# Patient Record
Sex: Female | Born: 1950 | Race: Black or African American | Hispanic: No | Smoking: Former smoker
Health system: Southern US, Community
[De-identification: ages and names within clinical notes are randomized; demographics above are authoritative.]

## PROBLEM LIST (undated history)

## (undated) DIAGNOSIS — K5190 Ulcerative colitis, unspecified, without complications: Secondary | ICD-10-CM

## (undated) DIAGNOSIS — D649 Anemia, unspecified: Secondary | ICD-10-CM

## (undated) DIAGNOSIS — K219 Gastro-esophageal reflux disease without esophagitis: Secondary | ICD-10-CM

## (undated) DIAGNOSIS — M19049 Primary osteoarthritis, unspecified hand: Secondary | ICD-10-CM

## (undated) DIAGNOSIS — M1990 Unspecified osteoarthritis, unspecified site: Secondary | ICD-10-CM

## (undated) DIAGNOSIS — E7800 Pure hypercholesterolemia, unspecified: Secondary | ICD-10-CM

## (undated) DIAGNOSIS — K589 Irritable bowel syndrome without diarrhea: Secondary | ICD-10-CM

## (undated) HISTORY — PX: ABDOMINAL HYSTERECTOMY: SHX81

---

## 1977-11-22 HISTORY — PX: TUBAL LIGATION: SHX77

## 2015-02-12 HISTORY — PX: SALIVARY GLAND SURGERY: SHX768

## 2015-03-02 ENCOUNTER — Other Ambulatory Visit: Payer: Self-pay | Admitting: Orthopedic Surgery

## 2015-03-26 ENCOUNTER — Encounter (HOSPITAL_BASED_OUTPATIENT_CLINIC_OR_DEPARTMENT_OTHER): Payer: Self-pay | Admitting: *Deleted

## 2015-03-26 DIAGNOSIS — M19049 Primary osteoarthritis, unspecified hand: Secondary | ICD-10-CM

## 2015-03-26 HISTORY — DX: Primary osteoarthritis, unspecified hand: M19.049

## 2015-04-02 ENCOUNTER — Ambulatory Visit (HOSPITAL_BASED_OUTPATIENT_CLINIC_OR_DEPARTMENT_OTHER): Payer: BLUE CROSS/BLUE SHIELD | Admitting: Anesthesiology

## 2015-04-02 ENCOUNTER — Encounter (HOSPITAL_BASED_OUTPATIENT_CLINIC_OR_DEPARTMENT_OTHER): Payer: Self-pay | Admitting: *Deleted

## 2015-04-02 ENCOUNTER — Encounter (HOSPITAL_BASED_OUTPATIENT_CLINIC_OR_DEPARTMENT_OTHER)
Admission: RE | Disposition: A | Discharge status: Home / Self Care | Payer: Self-pay | Source: Ambulatory Visit | Attending: Orthopedic Surgery

## 2015-04-02 ENCOUNTER — Ambulatory Visit (HOSPITAL_BASED_OUTPATIENT_CLINIC_OR_DEPARTMENT_OTHER)
Admission: RE | Admit: 2015-04-02 | Discharge: 2015-04-02 | Disposition: A | Discharge status: Home / Self Care | Payer: BLUE CROSS/BLUE SHIELD | Source: Ambulatory Visit | Attending: Orthopedic Surgery | Admitting: Orthopedic Surgery

## 2015-04-02 DIAGNOSIS — E780 Pure hypercholesterolemia: Secondary | ICD-10-CM | POA: Diagnosis not present

## 2015-04-02 DIAGNOSIS — Z86718 Personal history of other venous thrombosis and embolism: Secondary | ICD-10-CM | POA: Insufficient documentation

## 2015-04-02 DIAGNOSIS — Z886 Allergy status to analgesic agent status: Secondary | ICD-10-CM | POA: Insufficient documentation

## 2015-04-02 DIAGNOSIS — K219 Gastro-esophageal reflux disease without esophagitis: Secondary | ICD-10-CM | POA: Diagnosis not present

## 2015-04-02 DIAGNOSIS — M180 Bilateral primary osteoarthritis of first carpometacarpal joints: Secondary | ICD-10-CM | POA: Diagnosis not present

## 2015-04-02 DIAGNOSIS — Z91040 Latex allergy status: Secondary | ICD-10-CM | POA: Insufficient documentation

## 2015-04-02 DIAGNOSIS — K589 Irritable bowel syndrome without diarrhea: Secondary | ICD-10-CM | POA: Insufficient documentation

## 2015-04-02 DIAGNOSIS — M19041 Primary osteoarthritis, right hand: Secondary | ICD-10-CM | POA: Diagnosis not present

## 2015-04-02 DIAGNOSIS — Z885 Allergy status to narcotic agent status: Secondary | ICD-10-CM | POA: Insufficient documentation

## 2015-04-02 DIAGNOSIS — K279 Peptic ulcer, site unspecified, unspecified as acute or chronic, without hemorrhage or perforation: Secondary | ICD-10-CM | POA: Insufficient documentation

## 2015-04-02 DIAGNOSIS — M25742 Osteophyte, left hand: Secondary | ICD-10-CM | POA: Diagnosis not present

## 2015-04-02 HISTORY — DX: Ulcerative colitis, unspecified, without complications: K51.90

## 2015-04-02 HISTORY — DX: Gastro-esophageal reflux disease without esophagitis: K21.9

## 2015-04-02 HISTORY — DX: Irritable bowel syndrome, unspecified: K58.9

## 2015-04-02 HISTORY — DX: Pure hypercholesterolemia, unspecified: E78.00

## 2015-04-02 HISTORY — DX: Unspecified osteoarthritis, unspecified site: M19.90

## 2015-04-02 HISTORY — PX: CARPOMETACARPEL SUSPENSION PLASTY: SHX5005

## 2015-04-02 HISTORY — DX: Primary osteoarthritis, unspecified hand: M19.049

## 2015-04-02 SURGERY — CARPOMETACARPEL (CMC) SUSPENSION PLASTY
Anesthesia: General | Site: Hand | Laterality: Left

## 2015-04-02 MED ORDER — ONDANSETRON HCL 4 MG/2ML IJ SOLN
INTRAMUSCULAR | Status: AC
  Filled 2015-04-02: qty 2

## 2015-04-02 MED ORDER — HYDROMORPHONE HCL 1 MG/ML IJ SOLN: 0.2500 mg | INTRAMUSCULAR | Status: DC | PRN

## 2015-04-02 MED ORDER — FENTANYL CITRATE (PF) 100 MCG/2ML IJ SOLN
INTRAMUSCULAR | Status: AC
  Filled 2015-04-02: qty 2

## 2015-04-02 MED ORDER — FENTANYL CITRATE (PF) 100 MCG/2ML IJ SOLN
50.0000 ug | INTRAMUSCULAR | Status: DC | PRN
  Administered 2015-04-02: 75 ug via INTRAVENOUS

## 2015-04-02 MED ORDER — CEFAZOLIN SODIUM-DEXTROSE 2-3 GM-% IV SOLR
2.0000 g | INTRAVENOUS | Status: AC
  Administered 2015-04-02: 2 g via INTRAVENOUS

## 2015-04-02 MED ORDER — FENTANYL CITRATE (PF) 100 MCG/2ML IJ SOLN
INTRAMUSCULAR | Status: AC
  Filled 2015-04-02: qty 4

## 2015-04-02 MED ORDER — CHLORHEXIDINE GLUCONATE 4 % EX LIQD
60.0000 mL | Freq: Once | CUTANEOUS | Status: DC
Start: 2015-04-02 — End: 2015-04-02

## 2015-04-02 MED ORDER — OXYCODONE-ACETAMINOPHEN 10-325 MG PO TABS: 1.0000 | ORAL_TABLET | ORAL | Status: DC | PRN

## 2015-04-02 MED ORDER — VANCOMYCIN HCL IN DEXTROSE 1-5 GM/200ML-% IV SOLN: 1000.0000 mg | INTRAVENOUS | Status: DC

## 2015-04-02 MED ORDER — PHENYLEPHRINE 40 MCG/ML (10ML) SYRINGE FOR IV PUSH (FOR BLOOD PRESSURE SUPPORT)
PREFILLED_SYRINGE | INTRAVENOUS | Status: AC
  Filled 2015-04-02: qty 10

## 2015-04-02 MED ORDER — BUPIVACAINE-EPINEPHRINE (PF) 0.5% -1:200000 IJ SOLN
INTRAMUSCULAR | Status: DC | PRN
  Administered 2015-04-02: 30 mL via PERINEURAL

## 2015-04-02 MED ORDER — ONDANSETRON HCL 4 MG/2ML IJ SOLN
INTRAMUSCULAR | Status: DC | PRN
  Administered 2015-04-02: 4 mg via INTRAVENOUS

## 2015-04-02 MED ORDER — SCOPOLAMINE 1 MG/3DAYS TD PT72: 1.0000 | MEDICATED_PATCH | Freq: Once | TRANSDERMAL | Status: DC | PRN

## 2015-04-02 MED ORDER — GLYCOPYRROLATE 0.2 MG/ML IJ SOLN: 0.2000 mg | Freq: Once | INTRAMUSCULAR | Status: DC | PRN

## 2015-04-02 MED ORDER — MIDAZOLAM HCL 2 MG/2ML IJ SOLN
INTRAMUSCULAR | Status: AC
  Filled 2015-04-02: qty 4

## 2015-04-02 MED ORDER — MIDAZOLAM HCL 2 MG/2ML IJ SOLN
INTRAMUSCULAR | Status: AC
  Filled 2015-04-02: qty 2

## 2015-04-02 MED ORDER — DEXAMETHASONE SODIUM PHOSPHATE 10 MG/ML IJ SOLN
INTRAMUSCULAR | Status: DC | PRN
  Administered 2015-04-02: 10 mg via INTRAVENOUS

## 2015-04-02 MED ORDER — LIDOCAINE HCL (CARDIAC) 20 MG/ML IV SOLN
INTRAVENOUS | Status: AC
  Filled 2015-04-02: qty 5

## 2015-04-02 MED ORDER — CEFAZOLIN SODIUM-DEXTROSE 2-3 GM-% IV SOLR
INTRAVENOUS | Status: AC
  Filled 2015-04-02: qty 50

## 2015-04-02 MED ORDER — MIDAZOLAM HCL 2 MG/2ML IJ SOLN
1.0000 mg | INTRAMUSCULAR | Status: DC | PRN
  Administered 2015-04-02: 1.5 mg via INTRAVENOUS

## 2015-04-02 MED ORDER — LACTATED RINGERS IV SOLN
INTRAVENOUS | Status: DC
  Administered 2015-04-02: 11:00:00 via INTRAVENOUS
  Administered 2015-04-02: 10 mL/h via INTRAVENOUS

## 2015-04-02 MED ORDER — PROPOFOL 10 MG/ML IV BOLUS
INTRAVENOUS | Status: DC | PRN
  Administered 2015-04-02: 200 mg via INTRAVENOUS

## 2015-04-02 MED ORDER — PROPOFOL 500 MG/50ML IV EMUL
INTRAVENOUS | Status: AC
  Filled 2015-04-02: qty 50

## 2015-04-02 MED ORDER — DEXAMETHASONE SODIUM PHOSPHATE 10 MG/ML IJ SOLN
INTRAMUSCULAR | Status: AC
  Filled 2015-04-02: qty 1

## 2015-04-02 MED ORDER — PROMETHAZINE HCL 25 MG/ML IJ SOLN: 6.2500 mg | INTRAMUSCULAR | Status: DC | PRN

## 2015-04-02 MED ORDER — CHLORHEXIDINE GLUCONATE 4 % EX LIQD: 60.0000 mL | Freq: Once | CUTANEOUS | Status: DC

## 2015-04-02 MED ORDER — LIDOCAINE HCL (CARDIAC) 20 MG/ML IV SOLN
INTRAVENOUS | Status: DC | PRN
  Administered 2015-04-02: 50 mg via INTRAVENOUS

## 2015-04-02 SURGICAL SUPPLY — 65 items
BIT DRILL 1/16X5 DISP (BIT) ×2 IMPLANT
BIT DRILL JACOB END 9/64INX5IN (BIT) ×2 IMPLANT
BLADE ARTHRO LOK 4 BEAVER (BLADE) ×2 IMPLANT
BLADE MINI RND TIP GREEN BEAV (BLADE) ×2 IMPLANT
BLADE SURG 15 STRL LF DISP TIS (BLADE) ×1 IMPLANT
BLADE SURG 15 STRL SS (BLADE) ×1
BNDG COHESIVE 3X5 TAN STRL LF (GAUZE/BANDAGES/DRESSINGS) ×2 IMPLANT
BNDG ESMARK 4X9 LF (GAUZE/BANDAGES/DRESSINGS) ×2 IMPLANT
BNDG GAUZE ELAST 4 BULKY (GAUZE/BANDAGES/DRESSINGS) ×2 IMPLANT
BUR EGG 3PK/BX (BURR) IMPLANT
CHLORAPREP W/TINT 26ML (MISCELLANEOUS) ×2 IMPLANT
CORDS BIPOLAR (ELECTRODE) ×2 IMPLANT
COVER BACK TABLE 60X90IN (DRAPES) ×2 IMPLANT
COVER MAYO STAND STRL (DRAPES) ×4 IMPLANT
CUFF TOURNIQUET SINGLE 18IN (TOURNIQUET CUFF) ×2 IMPLANT
DECANTER SPIKE VIAL GLASS SM (MISCELLANEOUS) IMPLANT
DRAPE EXTREMITY T 121X128X90 (DRAPE) ×2 IMPLANT
DRAPE OEC MINIVIEW 54X84 (DRAPES) ×2 IMPLANT
DRAPE SURG 17X23 STRL (DRAPES) ×2 IMPLANT
GAUZE SPONGE 4X4 12PLY STRL (GAUZE/BANDAGES/DRESSINGS) ×2 IMPLANT
GAUZE SPONGE 4X4 16PLY XRAY LF (GAUZE/BANDAGES/DRESSINGS) IMPLANT
GAUZE XEROFORM 1X8 LF (GAUZE/BANDAGES/DRESSINGS) ×2 IMPLANT
GLOVE BIOGEL M STRL SZ7.5 (GLOVE) ×2 IMPLANT
GLOVE BIOGEL PI IND STRL 7.0 (GLOVE) ×1 IMPLANT
GLOVE BIOGEL PI IND STRL 8.5 (GLOVE) ×1 IMPLANT
GLOVE BIOGEL PI INDICATOR 7.0 (GLOVE) ×1
GLOVE BIOGEL PI INDICATOR 8.5 (GLOVE) ×1
GLOVE SURG ORTHO 8.0 STRL STRW (GLOVE) IMPLANT
GLOVE SURG SS PI 6.5 STRL IVOR (GLOVE) ×2 IMPLANT
GLOVE SURG SS PI 7.5 STRL IVOR (GLOVE) ×2 IMPLANT
GLOVE SURG SS PI 8.0 STRL IVOR (GLOVE) ×2 IMPLANT
GOWN STRL REUS W/ TWL LRG LVL3 (GOWN DISPOSABLE) ×2 IMPLANT
GOWN STRL REUS W/TWL LRG LVL3 (GOWN DISPOSABLE) ×2
GOWN STRL REUS W/TWL XL LVL3 (GOWN DISPOSABLE) ×2 IMPLANT
NEEDLE PRECISIONGLIDE 27X1.5 (NEEDLE) ×2 IMPLANT
NS IRRIG 1000ML POUR BTL (IV SOLUTION) ×2 IMPLANT
PACK BASIN DAY SURGERY FS (CUSTOM PROCEDURE TRAY) ×2 IMPLANT
PAD CAST 3X4 CTTN HI CHSV (CAST SUPPLIES) ×1 IMPLANT
PADDING CAST ABS 3INX4YD NS (CAST SUPPLIES)
PADDING CAST ABS COTTON 3X4 (CAST SUPPLIES) IMPLANT
PADDING CAST COTTON 3X4 STRL (CAST SUPPLIES) ×1
RUBBERBAND STERILE (MISCELLANEOUS) IMPLANT
SLEEVE SCD COMPRESS KNEE MED (MISCELLANEOUS) ×2 IMPLANT
SLING ARM FOAM STRAP MED (SOFTGOODS) ×2 IMPLANT
SPLINT PLASTER CAST XFAST 3X15 (CAST SUPPLIES) IMPLANT
SPLINT PLASTER XTRA FASTSET 3X (CAST SUPPLIES)
STOCKINETTE 4X48 STRL (DRAPES) ×2 IMPLANT
SUT ETHIBOND 2 OS 4 DA (SUTURE) IMPLANT
SUT ETHIBOND 3-0 V-5 (SUTURE) IMPLANT
SUT ETHILON 4 0 PS 2 18 (SUTURE) ×2 IMPLANT
SUT FIBERWIRE 2-0 18 17.9 3/8 (SUTURE)
SUT FIBERWIRE 4-0 18 DIAM BLUE (SUTURE) ×2
SUT MERSILENE 4 0 P 3 (SUTURE) IMPLANT
SUT STEEL 3 0 (SUTURE) IMPLANT
SUT STEEL 4 0 (SUTURE) ×2 IMPLANT
SUT VIC AB 4-0 P-3 18XBRD (SUTURE) IMPLANT
SUT VIC AB 4-0 P2 18 (SUTURE) IMPLANT
SUT VIC AB 4-0 P3 18 (SUTURE)
SUTURE FIBERWR 2-0 18 17.9 3/8 (SUTURE) IMPLANT
SUTURE FIBERWR 4-0 18 DIA BLUE (SUTURE) ×1 IMPLANT
SYR BULB 3OZ (MISCELLANEOUS) ×2 IMPLANT
SYR CONTROL 10ML LL (SYRINGE) ×2 IMPLANT
TOWEL OR 17X24 6PK STRL BLUE (TOWEL DISPOSABLE) ×2 IMPLANT
TOWEL OR NON WOVEN STRL DISP B (DISPOSABLE) IMPLANT
UNDERPAD 30X30 (UNDERPADS AND DIAPERS) ×2 IMPLANT

## 2015-04-02 NOTE — Brief Op Note (Signed)
04/02/2015  12:11 PM  PATIENT:  Paige Turner  64 y.o. female  PRE-OPERATIVE DIAGNOSIS:  Carpometacarpal Arthritis Left Thumb  POST-OPERATIVE DIAGNOSIS:  Carpometacarpal Arthritis Left Thumb  PROCEDURE:  Procedure(s): EXCISION TRAPEZIUM SUSPENSIONPLASTY LEFT THUMB ABDUCTOR POLLICIS LONGUS TRANSFER (Left)  SURGEON:  Surgeon(s) and Role:    * Cindee Salt, MD - Primary  PHYSICIAN ASSISTANT:   ASSISTANTS: R Dasnoit,PAC   ANESTHESIA:   regional and general  EBL:  Total I/O In: 1500 [I.V.:1500] Out: -   BLOOD ADMINISTERED:none  DRAINS: none   LOCAL MEDICATIONS USED:  NONE  SPECIMEN:  No Specimen  DISPOSITION OF SPECIMEN:  N/A  COUNTS:  YES  TOURNIQUET:   Total Tourniquet Time Documented: Upper Arm (Left) - 74 minutes Total: Upper Arm (Left) - 74 minutes   DICTATION: .Other Dictation: Dictation Number [id number]  PLAN OF CARE: Discharge to home after PACU  PATIENT DISPOSITION:  PACU - hemodynamically stable.

## 2015-04-02 NOTE — H&P (Signed)
Paige Turner is a 64 year-old right-hand dominant female complaining of bilateral CMC pain.  She has been on anti-inflammatories, splinting, paraffin wax bath, glucosamine and chondroitin sulfate, she has had injections done and this has not resolved symptoms for her.  This has been going on for approximately four years. She has been treated in [facility] by Dr. Rosalia Hammers, [place] and, subsequently, Cregan, who has referred her.  She has been on meloxicam, tramadol, diclofenac. She has been on prednisone all without relief.  She has no history of diabetes, thyroid problems, arthritis or gout.   ALLERGIES:    Aspirin, Tylenol with codeine and Augmentin   MEDICATIONS:   AcipHex, WelChol, Lialda, meloxicam, pantoprazole, clonidine, glycopyrrolate, mercaptopurine, Rowena, hyoscyamine, Uceris, tramadol, diclofenac, omeprazole and ondansetron.  PAST SURGICAL HISTORY:    Partial hysterectomy.  FAMILY MEDICAL HISTORY:  Positive for arthritis otherwise negative.   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  REVIEW OF SYSTEMS:   Positive for glasses, contacts, otherwise negative 14 points.  Paige Turner is an 64 y.o. female.   Chief Complaint: Pain left thumb HPI: see above  Past Medical History  Diagnosis Date  . Ulcerative colitis   . GERD (gastroesophageal reflux disease)   . Irritable bowel syndrome (IBS)   . Arthritis of carpometacarpal joint 03/2015    left thumb  . Arthritis     right hand  . High cholesterol     Past Surgical History  Procedure Laterality Date  . Salivary gland surgery  02/12/2015    blockage in gland  . Abdominal hysterectomy      partial  . Tubal ligation  11/1977    History reviewed. No pertinent family history. Social History:  reports that she has never smoked. She has never used smokeless tobacco. She reports that she drinks alcohol. She reports that she does not use illicit  drugs.  Allergies:  Allergies  Allergen Reactions  . Aspirin Other (See Comments)    GI UPSET  . Augmentin [Amoxicillin-Pot Clavulanate] Nausea And Vomiting  . Codeine Other (See Comments)    GI UPSET  . Latex Other (See Comments)    POSITIVE ON ALLERGY TESTING    Medications Prior to Admission  Medication Sig Dispense Refill  . Ascorbic Acid Buffered (BUFFERED VITAMIN C PO) Take by mouth.    . Biotin 1000 MCG tablet Take 1,000 mcg by mouth daily.    . Calcium Citrate-Vitamin D (CALCIUM + D PO) Take by mouth.    . cholecalciferol (VITAMIN D) 1000 UNITS tablet Take 1,000 Units by mouth daily.    . colesevelam (WELCHOL) 625 MG tablet Take 1,875 mg by mouth 2 (two) times daily with a meal.    . glucosamine-chondroitin 500-400 MG tablet Take 1 tablet by mouth 3 (three) times daily.    Marland Kitchen glycopyrrolate (ROBINUL) 2 MG tablet Take 2 mg by mouth 2 (two) times daily.    . mercaptopurine (PURINETHOL) 50 MG tablet Take 25 mg by mouth 2 (two) times daily. Give on an empty stomach 1 hour before or 2 hours after meals. Caution: Chemotherapy.    . mesalamine (LIALDA) 1.2 G EC tablet Take 4.8 g by mouth daily with breakfast.    . RABEprazole (ACIPHEX) 20 MG tablet Take 20 mg by mouth 2 (two) times daily.      No results found for this or any previous visit (from the past 48 hour(s)).  No results  found.   Pertinent items are noted in HPI.  Blood pressure 140/82, pulse 66, temperature 97.8 F (36.6 C), temperature source Oral, resp. rate 20, height 5\' 7"  (1.702 m), weight 87.544 kg (193 lb), SpO2 100 %.  General appearance: alert, cooperative and appears stated age Head: Normocephalic, without obvious abnormality Neck: no JVD Resp: clear to auscultation bilaterally Cardio: regular rate and rhythm, S1, S2 normal, no murmur, click, rub or gallop GI: soft, non-tender; bowel sounds normal; no masses,  no organomegaly Extremities: pain left thumb base Pulses: 2+ and symmetric Skin: Skin color,  texture, turgor normal. No rashes or lesions Neurologic: Grossly normal Incision/Wound: na  Assessment/Plan DIAGNOSIS:   CMC arthritis, bilateral thumbs, Eaton stage IV, left side.  RECOMMENDATIONS/PLAN:   She would like to proceed with surgical intervention.   She is scheduled for suspensionplasty, left thumb as an outpatient under regional anesthesia.   The pre, peri and postoperative course were discussed along with the risks and complications.  The patient is aware there is no guarantee with the surgery, possibility of infection, recurrence, injury to arteries, nerves, tendons, incomplete relief of symptoms and dystrophy.   Briasia Flinders R 04/02/2015, 9:28 AM

## 2015-04-02 NOTE — Anesthesia Postprocedure Evaluation (Signed)
  Anesthesia Post-op Note  Patient: Paige Turner  Procedure(s) Performed: Procedure(s): EXCISION TRAPEZIUM SUSPENSIONPLASTY LEFT THUMB ABDUCTOR POLLICIS LONGUS TRANSFER (Left)  Patient Location: PACU  Anesthesia Type:General and Regional  Level of Consciousness: awake  Airway and Oxygen Therapy: Patient Spontanous Breathing  Post-op Pain: none  Post-op Assessment: Post-op Vital signs reviewed, Patient's Cardiovascular Status Stable, Respiratory Function Stable, Patent Airway, No signs of Nausea or vomiting and Pain level controlled              Post-op Vital Signs: Reviewed and stable  Last Vitals:  Filed Vitals:   04/02/15 1351  BP: 129/73  Pulse: 60  Temp: 36.6 C  Resp: 18    Complications: No apparent anesthesia complications

## 2015-04-02 NOTE — Progress Notes (Signed)
Assisted Dr. Massagee with left, ultrasound guided, supraclavicular block. Side rails up, monitors on throughout procedure. See vital signs in flow sheet. Tolerated Procedure well. 

## 2015-04-02 NOTE — Transfer of Care (Signed)
Immediate Anesthesia Transfer of Care Note  Patient: Paige Turner  Procedure(s) Performed: Procedure(s): EXCISION TRAPEZIUM SUSPENSIONPLASTY LEFT THUMB ABDUCTOR POLLICIS LONGUS TRANSFER (Left)  Patient Location: PACU  Anesthesia Type:General  Level of Consciousness: awake  Airway & Oxygen Therapy: Patient Spontanous Breathing and Patient connected to face mask oxygen  Post-op Assessment: Report given to RN and Post -op Vital signs reviewed and stable  Post vital signs: Reviewed and stable  Last Vitals:  Filed Vitals:   04/02/15 1000  BP: 116/62  Pulse: 61  Temp:   Resp: 9    Complications: No apparent anesthesia complications

## 2015-04-02 NOTE — Anesthesia Preprocedure Evaluation (Addendum)
Anesthesia Evaluation  Patient identified by MRN, date of birth, ID band Patient awake    Reviewed: Allergy & Precautions, NPO status , Patient's Chart, lab work & pertinent test results  History of Anesthesia Complications Negative for: history of anesthetic complications  Airway Mallampati: III  TM Distance: <3 FB Neck ROM: Limited    Dental  (+) Teeth Intact   Pulmonary neg pulmonary ROS,    breath sounds clear to auscultation       Cardiovascular + DVT  negative cardio ROS   Rhythm:Regular Rate:Normal     Neuro/Psych negative neurological ROS     GI/Hepatic Neg liver ROS, PUD, GERD  ,  Endo/Other  negative endocrine ROS  Renal/GU negative Renal ROS     Musculoskeletal  (+) Arthritis ,   Abdominal   Peds  Hematology   Anesthesia Other Findings   Reproductive/Obstetrics                             Anesthesia Physical Anesthesia Plan  ASA: II  Anesthesia Plan: General   Post-op Pain Management: GA combined w/ Regional for post-op pain   Induction: Intravenous  Airway Management Planned: LMA  Additional Equipment:   Intra-op Plan:   Post-operative Plan: Extubation in OR  Informed Consent: I have reviewed the patients History and Physical, chart, labs and discussed the procedure including the risks, benefits and alternatives for the proposed anesthesia with the patient or authorized representative who has indicated his/her understanding and acceptance.   Dental advisory given  Plan Discussed with: CRNA and Surgeon  Anesthesia Plan Comments:         Anesthesia Quick Evaluation

## 2015-04-02 NOTE — Anesthesia Procedure Notes (Addendum)
Anesthesia Regional Block:  Supraclavicular block  Pre-Anesthetic Checklist: ,, timeout performed, Correct Patient, Correct Site, Correct Laterality, Correct Procedure, Correct Position, site marked, Risks and benefits discussed,  Surgical consent,  Pre-op evaluation,  At surgeon's request and post-op pain management  Laterality: Left and Upper     Needles:   Needle Type: Stimulator Needle - 80     Needle Length: 9cm 9 cm   Needle insertion depth: 5 cm   Additional Needles:  Procedures: ultrasound guided (picture in chart) and nerve stimulator Supraclavicular block Narrative:  Start time: 04/02/2015 9:50 AM End time: 04/02/2015 10:05 AM Injection made incrementally with aspirations every 5 mL.  Performed by: Personally  Anesthesiologist: MASSAGEE, TERRY  Additional Notes: Tolerated well   Procedure Name: LMA Insertion Date/Time: 04/02/2015 10:44 AM Performed by: Caren Macadam Pre-anesthesia Checklist: Patient identified, Emergency Drugs available, Suction available and Patient being monitored Patient Re-evaluated:Patient Re-evaluated prior to inductionOxygen Delivery Method: Circle System Utilized Preoxygenation: Pre-oxygenation with 100% oxygen Intubation Type: IV induction Ventilation: Mask ventilation without difficulty LMA: LMA inserted LMA Size: 4.0 Number of attempts: 1 Airway Equipment and Method: Bite block Placement Confirmation: positive ETCO2 and breath sounds checked- equal and bilateral Tube secured with: Tape Dental Injury: Teeth and Oropharynx as per pre-operative assessment

## 2015-04-02 NOTE — Op Note (Signed)
   Dictation Number [id number]

## 2015-04-02 NOTE — Discharge Instructions (Addendum)
[facility] Instructions °Hand Surgery ° °Wound Care: °Keep your hand elevated above the level of your heart.  Do not allow it to dangle by your side.  Keep the dressing dry and do not remove it unless your doctor advises you to do so.  He will usually change it at the time of your post-op visit.  Moving your fingers is advised to stimulate circulation but will depend on the site of your surgery.  If you have a splint applied, your doctor will advise you regarding movement. ° °Activity: °Do not drive or operate machinery today.  Rest today and then you may return to your normal activity and work as indicated by your physician. ° °Diet:  °Drink liquids today or eat a light diet.  You may resume a regular diet tomorrow.   ° °General expectations: °Pain for two to three days. °Fingers may become slightly swollen. ° °Call your doctor if any of the following occur: °Severe pain not relieved by pain medication. °Elevated temperature. °Dressing soaked with blood. °Inability to move fingers. °White or bluish color to fingers. ° ° °Post Anesthesia Home Care Instructions ° °Activity: °Get plenty of rest for the remainder of the day. A responsible adult should stay with you for 24 hours following the procedure.  °For the next 24 hours, DO NOT: °-Drive a car °-Operate machinery °-Drink alcoholic beverages °-Take any medication unless instructed by your physician °-Make any legal decisions or sign important papers. ° °Meals: °Start with liquid foods such as gelatin or soup. Progress to regular foods as tolerated. Avoid greasy, spicy, heavy foods. If nausea and/or vomiting occur, drink only clear liquids until the nausea and/or vomiting subsides. Call your physician if vomiting continues. ° °Special Instructions/Symptoms: °Your throat may feel dry or sore from the anesthesia or the breathing tube placed in your throat during surgery. If this causes discomfort, gargle with warm salt water. The discomfort should disappear within 24  hours. ° °If you had a scopolamine patch placed behind your ear for the management of post- operative nausea and/or vomiting: ° °1. The medication in the patch is effective for 72 hours, after which it should be removed.  Wrap patch in a tissue and discard in the trash. Wash hands thoroughly with soap and water. °2. You may remove the patch earlier than 72 hours if you experience unpleasant side effects which may include dry mouth, dizziness or visual disturbances. °3. Avoid touching the patch. Wash your hands with soap and water after contact with the patch. °  °Call your surgeon if you experience:  ° °1.  Fever over 101.0. °2.  Inability to urinate. °3.  Nausea and/or vomiting. °4.  Extreme swelling or bruising at the surgical site. °5.  Continued bleeding from the incision. °6.  Increased pain, redness or drainage from the incision. °7.  Problems related to your pain medication. °8. Any change in color, movement and/or sensation °9. Any problems and/or concerns ° ° °

## 2015-04-03 ENCOUNTER — Encounter (HOSPITAL_BASED_OUTPATIENT_CLINIC_OR_DEPARTMENT_OTHER): Payer: Self-pay | Admitting: Orthopedic Surgery

## 2015-04-03 NOTE — Op Note (Signed)
NAMENILI, HONDA NO.:  [id number]  MEDICAL RECORD NO.:  [id number]  LOCATION:                                 FACILITY:  PHYSICIAN:  Cindee Salt, M.D.            DATE OF BIRTH:  DATE OF PROCEDURE:  04/02/2015 DATE OF DISCHARGE:                              OPERATIVE REPORT   PREOPERATIVE DIAGNOSIS:  Carpometacarpal (CMC) arthritis, pantrapezial in nature, left thumb.  POSTOPERATIVE DIAGNOSIS:  Carpometacarpal (CMC) arthritis, pantrapezial in nature, left thumb.  OPERATION:  Excision of trapezium, resection of proximal trapezoid, suspensionplasty using APL, left thumb.  SURGEON:  Cindee Salt, M.D.  ASSISTANT:  Jonni Sanger, PAC.  ANESTHESIA:  Supraclavicular block, general.  ANESTHESIOLOGIST:  Burna Forts, M.D.  HISTORY:  The patient is a 64 year old female, with a history of carpometacarpal arthritis, pantrapezial in nature of her left thumb. This is not responded to conservative treatment.  She has elected to undergo reconstruction with suspensionplasty.  Pre, peri, and postoperative course have been discussed along with risks and complications.  She is aware that there is no guarantee with the surgery, possibility of infection; recurrence of injury to arteries, nerves or tendons; incomplete relief of symptoms; and dystrophy.  In the preoperative area, the patient is seen, the extremity marked by both patient and surgeon.  Antibiotic given.  PROCEDURE IN DETAIL:  The patient was brought to the operating room, where a supraclavicular block having been carried out in the preoperative area, was supplemented with general anesthesia.  She was prepped and draped in supine position with the left arm free.  Prep was done with ChloraPrep, 3 minute dry time allowed.  Time-out taken, confirming the patient and procedure.  The limb was exsanguinated with an Esmarch bandage.  Tourniquet placed high on the arm was inflated to 250 mmHg.  A  curvilinear incision was made over the base of the thumb metacarpal onto the line, in line with the first dorsal compartment, carried down through the subcutaneous tissue.  Radial sensory nerves were identified and protected.  The dissection was carried between the extensor pollicis brevis and the abductor pollicis longus.  The carpometacarpal joint was opened and inspected.  This was found to be extremely arthritic.  The dissection was carried proximally to the STT joint.  This was also found to be arthritic with sharp dissection.  The radial artery was identified and protected.  The trapezium was then isolated.  This had significant enlargement, both proximally and distally, secondary to osteophyte formation.  This was excised in 2 large pieces.  The proximal aspect of the trapezoid was also resected with a rongeur to free any arthritic change at the scaphotrapezoid joint.  The most dorsal aspect of the abductor pollicis longus tendon was then isolated.  This was inserting into the bone, drill holes were placed in the base of the thumb metacarpal, obliquely from the dorsal radial to the mid volar ulnar portion of the first metacarpal.  A separate incision was then made.  The harvest of the abductor pollicis longus was placed at the musculotendinous junction.  The abductor  pollicis longus was isolated.  A 30-gauge monofilament wire was then used as a cheese cutter to isolate the most dorsal portion of this. This was then transected at the musculotendinous junction.  The wound irrigated and the skin closed with interrupted 4-0 nylon sutures.  The abducted pollicis longus was then delivered distally.  The most volar aspect of the second metacarpal had been eroded by the trapezium and had a large defect in it.  This provided an adequate placement for the drill hole for the abductor pollicis longus transfer through the second metacarpal.  This was done from a volar to dorsal position.  A  separate incision was then made dorsally to allow passage of the abductor pollicis longus tendon.  This was attempted.  The tendon portion was found to be too large.  This was then separated into a three-quarter and one-quarter portion.  The three-quarter portion was then able to be passed through the 2 drill holes from the dorsal to palmar on the thumb metacarpal and from volar to dorsal on the index metacarpal.  A dissection was then carried along the periosteal margin of the second metacarpal, up to where the abductor pollicis longus tendon egressed from the second metacarpal and this tendon was then passed around the second metacarpal back into the volar wound of the vacated trapezium. This was then passed through the abductor pollicis longus tendon, sutured with figure-of-eight 3-0 FiberWire sutures and 2 separate passes.  This firmly fixed the tendon.  A moderate amount of tendon was still remaining with pressure.  There was no proximal translation of the first metacarpal on the second.  X-rays confirmed this.  The wounds were irrigated.  The remainder of the transected one-quarter of the abductor pollicis longus was then passed through the flexor carpi radialis forming a double-loop anchovy and sutured to itself with the 3-0 FiberWire sutures, providing a position very similar to gracilis transfer.  This was placed in the proximal aspect of the trapezium defect.  Wound was again irrigated.  The capsule could not be closed. This was then placed in its proper position.  The subcutaneous tissue closed with interrupted 4-0 Vicryl and the skin with interrupted 4-0 nylon sutures.  A sterile compressive dressing, dorsal palmar thumb spica splint was applied.  On deflation of the tourniquet, all fingers immediately pinked.  She was taken to the recovery room for observation in satisfactory condition.  She will be discharged home to return to the [facility] in 1 week on  Percocet.          ______________________________ Cindee Salt, M.D.     GK/MEDQ  D:  04/02/2015  T:  04/03/2015  Job:  [id number]

## 2015-11-23 HISTORY — PX: BUNIONECTOMY: SHX129

## 2016-01-14 ENCOUNTER — Other Ambulatory Visit: Payer: Self-pay | Admitting: Orthopaedic Surgery

## 2016-01-14 DIAGNOSIS — M47812 Spondylosis without myelopathy or radiculopathy, cervical region: Secondary | ICD-10-CM

## 2016-01-23 ENCOUNTER — Ambulatory Visit
Admission: RE | Admit: 2016-01-23 | Discharge: 2016-01-23 | Disposition: A | Discharge status: Home / Self Care | Payer: BLUE CROSS/BLUE SHIELD | Source: Ambulatory Visit | Attending: Orthopaedic Surgery | Admitting: Orthopaedic Surgery

## 2016-01-23 DIAGNOSIS — M47812 Spondylosis without myelopathy or radiculopathy, cervical region: Secondary | ICD-10-CM

## 2016-01-29 ENCOUNTER — Other Ambulatory Visit: Payer: Self-pay | Admitting: Orthopaedic Surgery

## 2016-01-29 DIAGNOSIS — M546 Pain in thoracic spine: Secondary | ICD-10-CM

## 2016-02-06 ENCOUNTER — Ambulatory Visit
Admission: RE | Admit: 2016-02-06 | Discharge: 2016-02-06 | Disposition: A | Discharge status: Home / Self Care | Payer: BLUE CROSS/BLUE SHIELD | Source: Ambulatory Visit | Attending: Orthopaedic Surgery | Admitting: Orthopaedic Surgery

## 2016-02-06 DIAGNOSIS — M546 Pain in thoracic spine: Secondary | ICD-10-CM

## 2016-02-26 ENCOUNTER — Encounter (HOSPITAL_COMMUNITY)
Admission: RE | Admit: 2016-02-26 | Discharge: 2016-02-26 | Disposition: A | Discharge status: Home / Self Care | Payer: BLUE CROSS/BLUE SHIELD | Source: Ambulatory Visit | Attending: Orthopaedic Surgery | Admitting: Orthopaedic Surgery

## 2016-02-26 ENCOUNTER — Ambulatory Visit (HOSPITAL_COMMUNITY)
Admission: RE | Admit: 2016-02-26 | Discharge: 2016-02-26 | Disposition: A | Discharge status: Home / Self Care | Payer: BLUE CROSS/BLUE SHIELD | Source: Ambulatory Visit | Attending: Surgery | Admitting: Surgery

## 2016-02-26 ENCOUNTER — Encounter (HOSPITAL_COMMUNITY): Payer: Self-pay

## 2016-02-26 DIAGNOSIS — Z01812 Encounter for preprocedural laboratory examination: Secondary | ICD-10-CM | POA: Insufficient documentation

## 2016-02-26 DIAGNOSIS — Z01818 Encounter for other preprocedural examination: Secondary | ICD-10-CM | POA: Diagnosis not present

## 2016-02-26 HISTORY — DX: Anemia, unspecified: D64.9

## 2016-02-26 LAB — URINE MICROSCOPIC-ADD ON: RBC / HPF: NONE SEEN RBC/hpf (ref 0–5)

## 2016-02-26 LAB — URINALYSIS, ROUTINE W REFLEX MICROSCOPIC
BILIRUBIN URINE: NEGATIVE
Glucose, UA: NEGATIVE mg/dL
KETONES UR: NEGATIVE mg/dL
NITRITE: NEGATIVE
PH: 6 (ref 5.0–8.0)
Protein, ur: NEGATIVE mg/dL
SPECIFIC GRAVITY, URINE: 1.012 (ref 1.005–1.030)

## 2016-02-26 LAB — COMPREHENSIVE METABOLIC PANEL
ALBUMIN: 3.8 g/dL (ref 3.5–5.0)
ALK PHOS: 60 U/L (ref 38–126)
ALT: 19 U/L (ref 14–54)
ANION GAP: 7 (ref 5–15)
AST: 20 U/L (ref 15–41)
BUN: 10 mg/dL (ref 6–20)
CALCIUM: 9.4 mg/dL (ref 8.9–10.3)
CO2: 25 mmol/L (ref 22–32)
Chloride: 107 mmol/L (ref 101–111)
Creatinine, Ser: 0.78 mg/dL (ref 0.44–1.00)
GFR calc Af Amer: >60 mL/min (ref >60)
GFR calc non Af Amer: >60 mL/min (ref >60)
GLUCOSE: 79 mg/dL (ref 65–99)
POTASSIUM: 4.1 mmol/L (ref 3.5–5.1)
SODIUM: 139 mmol/L (ref 135–145)
TOTAL PROTEIN: 7.1 g/dL (ref 6.5–8.1)
Total Bilirubin: 0.4 mg/dL (ref 0.3–1.2)

## 2016-02-26 LAB — CBC
HEMATOCRIT: 40.2 % (ref 36.0–46.0)
HEMOGLOBIN: 12.7 g/dL (ref 12.0–15.0)
MCH: 29.7 pg (ref 26.0–34.0)
MCHC: 31.6 g/dL (ref 30.0–36.0)
MCV: 93.9 fL (ref 78.0–100.0)
Platelets: 325 10*3/uL (ref 150–400)
RBC: 4.28 MIL/uL (ref 3.87–5.11)
RDW: 13.9 % (ref 11.5–15.5)
WBC: 5.5 10*3/uL (ref 4.0–10.5)

## 2016-02-26 LAB — PROTIME-INR
INR: 1.08
PROTHROMBIN TIME: 14 s (ref 11.4–15.2)

## 2016-02-26 LAB — APTT: aPTT: 31 seconds (ref 24–36)

## 2016-02-26 LAB — SURGICAL PCR SCREEN
MRSA, PCR: NEGATIVE
STAPHYLOCOCCUS AUREUS: NEGATIVE

## 2016-02-26 NOTE — Pre-Procedure Instructions (Signed)
    Paige Turner  02/26/2016      [id number] - [place], [place] - [address] [place] [address] Phone: [phone] Fax: [phone]    Your procedure is scheduled on 03-11-2016   Friday   Report to [facility] Admitting at 5:30 A.M.   Call this number if you have problems the morning of surgery:  [phone]   Remember:  Do not eat food or drink liquids after midnight.   Take these medicines the morning of surgery with A SIP OF WATER Robinul, mesalamine(Lialda),Rabeprazole(Aciphex)  STOP ASPIRIN,ANTIINFLAMATORIES (IBUPROFEN,ALEVE,MOTRIN,ADVIL,GOODY'S POWDERS),HERBAL SUPPLEMENTS,FISH OIL,AND VITAMINS 5-7 DAYS PRIOR TO SURGERY   Do not wear jewelry, make-up or nail polish.  Do not wear lotions, powders, or perfumes.  You may  NOT wear deoderant.  Do not shave 48 hours prior to surgery.   .  Do not bring valuables to the hospital.  [facility] is not responsible for any belongings or valuables.  Contacts, dentures or bridgework may not be worn into surgery.  Leave your suitcase in the car.  After surgery it may be brought to your room.  For patients admitted to the hospital, discharge time will be determined by your treatment team.  Patients discharged the day of surgery will not be allowed to drive home.    Special instructions:  See attached Sheet for instructions on CHG Shower  Please read over the following fact sheets that you were given. MRSA Information and Surgical Site Infection Prevention

## 2016-03-10 NOTE — Anesthesia Preprocedure Evaluation (Addendum)
Anesthesia Evaluation  Patient identified by MRN, date of birth, ID band Patient awake    Reviewed: Allergy & Precautions, NPO status , Patient's Chart, lab work & pertinent test results  History of Anesthesia Complications Negative for: history of anesthetic complications  Airway Mallampati: II  TM Distance: <3 FB Neck ROM: Limited   Comment: Cannot bite upper lip Dental  (+) Dental Advisory Given, Teeth Intact   Pulmonary neg shortness of breath, neg sleep apnea, neg COPD, neg recent URI, former smoker,    Pulmonary exam normal breath sounds clear to auscultation       Cardiovascular (-) hypertension(-) angina(-) Past MI, (-) Cardiac Stents, (-) CABG, (-) Orthopnea and (-) PND (-) dysrhythmias  Rhythm:Regular Rate:Normal     Neuro/Psych neg Seizures neck pain, left arm pain, left arm numbness, weakness.  She has noticed she has had a little bit of problems with her legs with weakness.  She denies any associated bowel or bladder symptoms.     GI/Hepatic Neg liver ROS, PUD, GERD  Medicated,Ulcerative colitis, IBS   Endo/Other  neg diabetes  Renal/GU negative Renal ROS     Musculoskeletal  (+) Arthritis ,   Abdominal   Peds  Hematology  (+) Blood dyscrasia, anemia ,   Anesthesia Other Findings HLD  Reproductive/Obstetrics                           Anesthesia Physical Anesthesia Plan  ASA: II  Anesthesia Plan: General   Post-op Pain Management:    Induction: Intravenous  Airway Management Planned: Oral ETT and Video Laryngoscope Planned  Additional Equipment:   Intra-op Plan:   Post-operative Plan: Extubation in OR  Informed Consent: I have reviewed the patients History and Physical, chart, labs and discussed the procedure including the risks, benefits and alternatives for the proposed anesthesia with the patient or authorized representative who has indicated his/her understanding  and acceptance.   Dental advisory given  Plan Discussed with:   Anesthesia Plan Comments:        Anesthesia Quick Evaluation

## 2016-03-10 NOTE — H&P (Signed)
Paige Turner is an 65 y.o. female.   A 65 year old female returns with ongoing problems with neck pain, left arm pain, left arm numbness, weakness.  She has noticed she has had a little bit of problems with her legs with weakness.  She denies any associated bowel or bladder symptoms.    CURRENT MEDICATIONS:  Include glycopyrrolate 2 mg 1 tablet twice a day, Lialda 1.2 mg 4 times a day, mercaptopurine 1/2 tablet in the morning, 1 tablet at night; rabeprazole sodium 20 mg daily, and Welchol 625 mg 3 tablets daily.  She also takes biotin, calcium, vitamin D, glucosamine, probiotic, and vitamin C.  She has been through a prednisone back, 11/12/15.  Taking Zofran for nausea.    ALLERGIES:  Codeine, aspirin, Augmentin and latex.    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 Lovenia Kimobb [place].  Primary care physician is Dr. Sherrin DaisyAimee Lischke.     Past Medical History:  Diagnosis Date  . Anemia   . Arthritis    right hand  . Arthritis of carpometacarpal joint 03/2015   left thumb  . GERD (gastroesophageal reflux disease)   . High cholesterol   . Irritable bowel syndrome (IBS)   . Ulcerative colitis [facility](HCC)     Past Surgical History:  Procedure Laterality Date  . ABDOMINAL HYSTERECTOMY     partial  . BUNIONECTOMY Left 11/2015   planta facititis  . CARPOMETACARPEL SUSPENSION PLASTY Left 04/02/2015   Procedure: EXCISION TRAPEZIUM SUSPENSIONPLASTY LEFT THUMB ABDUCTOR POLLICIS LONGUS TRANSFER;  Surgeon: Cindee SaltGary Kuzma, MD;  Location: [facility];  Service: Orthopedics;  Laterality: Left;  . SALIVARY GLAND SURGERY  02/12/2015   blockage in gland  . TUBAL LIGATION  11/1977    No family history on file. Social History:  reports that she quit smoking about 20 years ago. She has a 5.00 pack-year smoking history. She has never used smokeless tobacco. She reports  that she drinks alcohol. She reports that she does not use drugs.  Allergies:  Allergies  Allergen Reactions  . Aspirin Other (See Comments)    GI UPSET  . Augmentin [Amoxicillin-Pot Clavulanate] Nausea And Vomiting  . Codeine Other (See Comments)    GI UPSET  . Latex Other (See Comments)    POSITIVE ON ALLERGY TESTING    No prescriptions prior to admission.    No results found for this or any previous visit (from the past 48 hour(s)). No results found.  Review of Systems  Constitutional: Negative.   Eyes: Negative.   Respiratory: Negative.   Cardiovascular: Negative.   Gastrointestinal: Negative.   Genitourinary: Negative.   Musculoskeletal: Positive for neck pain.  Skin: Negative.   Endo/Heme/Allergies: Negative.   Psychiatric/Behavioral: Negative.     There were no vitals taken for this visit. Physical Exam  Constitutional: She is oriented to person, place, and time. No distress.  HENT:  Head: Normocephalic and atraumatic.  Eyes: EOM are normal. Pupils are equal, round, and reactive to light.  Neck: Normal range of motion.  Cardiovascular: Normal rate.   Respiratory: Effort normal. No respiratory distress.  GI: She exhibits distension.  Neurological: She is alert and oriented to person, place, and time.  Skin: Skin is warm and dry.  Psychiatric: She has a normal mood and affect.      PHYSICAL EXAMINATION:  The patient is 5 feet 7 inches,  190 pounds.  BP 148/88, pulse 61.  She has 2+ biceps, triceps, brachial radialis.  She is in a postop shoe.  Positive Spurling on the left, negative on the right.  She has sharp pain with flexion, chin to chest.    RADIOGRAPHS:  1.  Plain radiographs showed anterolisthesis, C4-5, and significant spondylosis at C5-6.  2.  MRI scan, January 25, 2016, is available for review.  This shows large paracentral protrusion with significant cord compression, increased signal within the cord suggesting gliosis and/or edema.  Minimal slope at  the C4-5 with mild foraminal narrowing and some left paracentral protrusion.  Mild left side cord flattening at C5-6.  Mild level changes at other levels, lower cervical spine.   PLAN:  We discussed options.  Her most significant level is C3-4, where she has cord compression, but also has significant disk protrusion.  She understands at this point surgery would be recommended because she has cord compression and progressive symptoms.  With cord gliosis, she understands that she may get progression of paralysis without appropriate surgical treatment.  She needs to make arrangements for her son to be off, available.  We discussed operative intervention, risks of surgery, possible progression of the C4-5 level in between, but it does not show significant compression at this point.  Risks of surgery discussed.  All questions answered.  She understands and agrees to proceed.  Naida SleightWENS,Nobie Alleyne M, PA-C 03/10/2016, 4:28 PM

## 2016-03-11 ENCOUNTER — Inpatient Hospital Stay (HOSPITAL_COMMUNITY): Payer: BLUE CROSS/BLUE SHIELD | Admitting: Anesthesiology

## 2016-03-11 ENCOUNTER — Encounter (HOSPITAL_COMMUNITY): Payer: Self-pay | Admitting: *Deleted

## 2016-03-11 ENCOUNTER — Inpatient Hospital Stay (HOSPITAL_COMMUNITY): Payer: BLUE CROSS/BLUE SHIELD

## 2016-03-11 ENCOUNTER — Encounter (HOSPITAL_COMMUNITY)
Admission: RE | Disposition: A | Discharge status: Home / Self Care | Payer: Self-pay | Source: Ambulatory Visit | Attending: Orthopaedic Surgery

## 2016-03-11 ENCOUNTER — Inpatient Hospital Stay (HOSPITAL_COMMUNITY)
Admission: RE | Admit: 2016-03-11 | Discharge: 2016-03-12 | DRG: 472 | Disposition: A | Discharge status: Home / Self Care | Payer: BLUE CROSS/BLUE SHIELD | Source: Ambulatory Visit | Attending: Orthopaedic Surgery | Admitting: Orthopaedic Surgery

## 2016-03-11 DIAGNOSIS — Z419 Encounter for procedure for purposes other than remedying health state, unspecified: Secondary | ICD-10-CM

## 2016-03-11 DIAGNOSIS — K219 Gastro-esophageal reflux disease without esophagitis: Secondary | ICD-10-CM | POA: Diagnosis present

## 2016-03-11 DIAGNOSIS — M5001 Cervical disc disorder with myelopathy,  high cervical region: Secondary | ICD-10-CM | POA: Diagnosis present

## 2016-03-11 DIAGNOSIS — M4712 Other spondylosis with myelopathy, cervical region: Secondary | ICD-10-CM | POA: Diagnosis present

## 2016-03-11 DIAGNOSIS — Z87891 Personal history of nicotine dependence: Secondary | ICD-10-CM

## 2016-03-11 DIAGNOSIS — M4312 Spondylolisthesis, cervical region: Principal | ICD-10-CM | POA: Diagnosis present

## 2016-03-11 DIAGNOSIS — M4802 Spinal stenosis, cervical region: Secondary | ICD-10-CM | POA: Diagnosis present

## 2016-03-11 HISTORY — PX: ANTERIOR CERVICAL DECOMP/DISCECTOMY FUSION: SHX1161

## 2016-03-11 HISTORY — PX: ANTERIOR CERVICAL DISCECTOMY: SHX1160

## 2016-03-11 SURGERY — ANTERIOR CERVICAL DECOMPRESSION/DISCECTOMY FUSION 2 LEVELS
Anesthesia: General | Site: Neck

## 2016-03-11 MED ORDER — PHENYLEPHRINE HCL 10 MG/ML IJ SOLN
INTRAMUSCULAR | Status: DC | PRN
  Administered 2016-03-11 (×5): 40 ug via INTRAVENOUS

## 2016-03-11 MED ORDER — PROMETHAZINE HCL 25 MG/ML IJ SOLN: 6.2500 mg | INTRAMUSCULAR | Status: DC | PRN

## 2016-03-11 MED ORDER — MERCAPTOPURINE 50 MG PO TABS
25.0000 mg | ORAL_TABLET | Freq: Every morning | ORAL | Status: DC
  Administered 2016-03-12: 25 mg via ORAL
  Filled 2016-03-11 (×3): qty 1

## 2016-03-11 MED ORDER — 0.9 % SODIUM CHLORIDE (POUR BTL) OPTIME
TOPICAL | Status: DC | PRN
  Administered 2016-03-11: 1000 mL

## 2016-03-11 MED ORDER — BUPIVACAINE-EPINEPHRINE (PF) 0.25% -1:200000 IJ SOLN
INTRAMUSCULAR | Status: AC
  Filled 2016-03-11: qty 30

## 2016-03-11 MED ORDER — SODIUM CHLORIDE 0.9% FLUSH: 3.0000 mL | INTRAVENOUS | Status: DC | PRN

## 2016-03-11 MED ORDER — MIDAZOLAM HCL 2 MG/2ML IJ SOLN
INTRAMUSCULAR | Status: AC
  Filled 2016-03-11: qty 2

## 2016-03-11 MED ORDER — ONDANSETRON HCL 4 MG/2ML IJ SOLN
INTRAMUSCULAR | Status: AC
  Filled 2016-03-11: qty 2

## 2016-03-11 MED ORDER — MESALAMINE 1.2 G PO TBEC
4.8000 g | DELAYED_RELEASE_TABLET | Freq: Every day | ORAL | Status: DC
  Administered 2016-03-12: 4.8 g via ORAL
  Filled 2016-03-11: qty 4

## 2016-03-11 MED ORDER — FENTANYL CITRATE (PF) 100 MCG/2ML IJ SOLN
INTRAMUSCULAR | Status: DC | PRN
  Administered 2016-03-11 (×3): 50 ug via INTRAVENOUS

## 2016-03-11 MED ORDER — ROCURONIUM BROMIDE 100 MG/10ML IV SOLN
INTRAVENOUS | Status: DC | PRN
  Administered 2016-03-11 (×5): 10 mg via INTRAVENOUS
  Administered 2016-03-11: 20 mg via INTRAVENOUS

## 2016-03-11 MED ORDER — SODIUM CHLORIDE 0.9% FLUSH: 3.0000 mL | Freq: Two times a day (BID) | INTRAVENOUS | Status: DC

## 2016-03-11 MED ORDER — LACTATED RINGERS IV SOLN
INTRAVENOUS | Status: DC | PRN
  Administered 2016-03-11 (×2): via INTRAVENOUS

## 2016-03-11 MED ORDER — MINERAL OIL LIGHT 100 % EX OIL
TOPICAL_OIL | CUTANEOUS | Status: AC
  Filled 2016-03-11: qty 25

## 2016-03-11 MED ORDER — KETOROLAC TROMETHAMINE 30 MG/ML IJ SOLN
30.0000 mg | Freq: Four times a day (QID) | INTRAMUSCULAR | Status: DC | PRN
  Administered 2016-03-12: 30 mg via INTRAVENOUS
  Filled 2016-03-11: qty 1

## 2016-03-11 MED ORDER — FENTANYL CITRATE (PF) 100 MCG/2ML IJ SOLN: 25.0000 ug | INTRAMUSCULAR | Status: DC | PRN

## 2016-03-11 MED ORDER — MIDAZOLAM HCL 5 MG/5ML IJ SOLN
INTRAMUSCULAR | Status: DC | PRN
  Administered 2016-03-11: 2 mg via INTRAVENOUS

## 2016-03-11 MED ORDER — SODIUM CHLORIDE 0.45 % IV SOLN
INTRAVENOUS | Status: DC
  Administered 2016-03-11: 21:00:00 via INTRAVENOUS

## 2016-03-11 MED ORDER — HYDROMORPHONE HCL 1 MG/ML IJ SOLN: 0.5000 mg | INTRAMUSCULAR | Status: DC | PRN

## 2016-03-11 MED ORDER — FENTANYL CITRATE (PF) 100 MCG/2ML IJ SOLN
INTRAMUSCULAR | Status: AC
  Filled 2016-03-11: qty 4

## 2016-03-11 MED ORDER — PHENYLEPHRINE HCL 10 MG/ML IJ SOLN
INTRAMUSCULAR | Status: DC | PRN
Start: 2016-03-11 — End: 2016-03-11
  Administered 2016-03-11: 20 ug/min via INTRAVENOUS

## 2016-03-11 MED ORDER — THROMBIN 5000 UNITS EX SOLR
CUTANEOUS | Status: AC
  Filled 2016-03-11: qty 5000

## 2016-03-11 MED ORDER — DEXAMETHASONE SODIUM PHOSPHATE 10 MG/ML IJ SOLN
INTRAMUSCULAR | Status: DC | PRN
  Administered 2016-03-11: 5 mg via INTRAVENOUS

## 2016-03-11 MED ORDER — CEFAZOLIN SODIUM-DEXTROSE 2-4 GM/100ML-% IV SOLN
2.0000 g | INTRAVENOUS | Status: AC
  Administered 2016-03-11: 2 g via INTRAVENOUS
  Filled 2016-03-11: qty 100

## 2016-03-11 MED ORDER — MERCAPTOPURINE 50 MG PO TABS
50.0000 mg | ORAL_TABLET | Freq: Every day | ORAL | Status: DC
  Filled 2016-03-11: qty 1

## 2016-03-11 MED ORDER — THROMBIN 5000 UNITS EX SOLR
CUTANEOUS | Status: DC | PRN
  Administered 2016-03-11: 2000 [IU] via TOPICAL

## 2016-03-11 MED ORDER — PHENOL 1.4 % MT LIQD
1.0000 | OROMUCOSAL | Status: DC | PRN
Start: 2016-03-11 — End: 2016-03-12

## 2016-03-11 MED ORDER — SUCCINYLCHOLINE 20MG/ML (10ML) SYRINGE FOR MEDFUSION PUMP - OPTIME
INTRAMUSCULAR | Status: DC | PRN
  Administered 2016-03-11: 100 mg via INTRAVENOUS

## 2016-03-11 MED ORDER — ONDANSETRON HCL 4 MG/2ML IJ SOLN: 4.0000 mg | INTRAMUSCULAR | Status: DC | PRN

## 2016-03-11 MED ORDER — DOCUSATE SODIUM 100 MG PO CAPS
100.0000 mg | ORAL_CAPSULE | Freq: Two times a day (BID) | ORAL | Status: DC
  Filled 2016-03-11 (×2): qty 1

## 2016-03-11 MED ORDER — OXYCODONE-ACETAMINOPHEN 5-325 MG PO TABS: 1.0000 | ORAL_TABLET | Freq: Four times a day (QID) | ORAL | 0 refills | Status: AC | PRN

## 2016-03-11 MED ORDER — SUCCINYLCHOLINE CHLORIDE 200 MG/10ML IV SOSY
PREFILLED_SYRINGE | INTRAVENOUS | Status: AC
  Filled 2016-03-11: qty 10

## 2016-03-11 MED ORDER — ROCURONIUM BROMIDE 10 MG/ML (PF) SYRINGE
PREFILLED_SYRINGE | INTRAVENOUS | Status: AC
  Filled 2016-03-11: qty 10

## 2016-03-11 MED ORDER — OXYCODONE-ACETAMINOPHEN 5-325 MG PO TABS
1.0000 | ORAL_TABLET | Freq: Four times a day (QID) | ORAL | Status: DC | PRN
  Administered 2016-03-11: 2 via ORAL
  Filled 2016-03-11: qty 2

## 2016-03-11 MED ORDER — ACETAMINOPHEN 650 MG RE SUPP: 650.0000 mg | RECTAL | Status: DC | PRN

## 2016-03-11 MED ORDER — PROPOFOL 10 MG/ML IV BOLUS
INTRAVENOUS | Status: AC
  Filled 2016-03-11: qty 40

## 2016-03-11 MED ORDER — SUGAMMADEX SODIUM 200 MG/2ML IV SOLN
INTRAVENOUS | Status: DC | PRN
  Administered 2016-03-11: 150 mg via INTRAVENOUS

## 2016-03-11 MED ORDER — COLESEVELAM HCL 625 MG PO TABS
1875.0000 mg | ORAL_TABLET | Freq: Two times a day (BID) | ORAL | Status: DC
  Administered 2016-03-11 – 2016-03-12 (×2): 1875 mg via ORAL
  Filled 2016-03-11 (×3): qty 3

## 2016-03-11 MED ORDER — LIDOCAINE HCL (CARDIAC) 20 MG/ML IV SOLN
INTRAVENOUS | Status: DC | PRN
  Administered 2016-03-11: 100 mg via INTRAVENOUS

## 2016-03-11 MED ORDER — ACETAMINOPHEN 10 MG/ML IV SOLN
1000.0000 mg | INTRAVENOUS | Status: AC
  Administered 2016-03-11: 1000 mg via INTRAVENOUS
  Filled 2016-03-11: qty 100

## 2016-03-11 MED ORDER — SUGAMMADEX SODIUM 200 MG/2ML IV SOLN
INTRAVENOUS | Status: AC
  Filled 2016-03-11: qty 2

## 2016-03-11 MED ORDER — BUPIVACAINE-EPINEPHRINE 0.25% -1:200000 IJ SOLN
INTRAMUSCULAR | Status: DC | PRN
  Administered 2016-03-11: 6 mL

## 2016-03-11 MED ORDER — PANTOPRAZOLE SODIUM 40 MG PO TBEC
40.0000 mg | DELAYED_RELEASE_TABLET | Freq: Every day | ORAL | Status: DC
  Administered 2016-03-12: 40 mg via ORAL
  Filled 2016-03-11: qty 1

## 2016-03-11 MED ORDER — TRAMADOL HCL 50 MG PO TABS
50.0000 mg | ORAL_TABLET | Freq: Four times a day (QID) | ORAL | Status: DC | PRN
  Administered 2016-03-12: 50 mg via ORAL
  Filled 2016-03-11: qty 1

## 2016-03-11 MED ORDER — ONDANSETRON HCL 4 MG/2ML IJ SOLN
INTRAMUSCULAR | Status: DC | PRN
  Administered 2016-03-11: 4 mg via INTRAVENOUS

## 2016-03-11 MED ORDER — DEXAMETHASONE SODIUM PHOSPHATE 10 MG/ML IJ SOLN
INTRAMUSCULAR | Status: AC
  Filled 2016-03-11: qty 1

## 2016-03-11 MED ORDER — SODIUM CHLORIDE 0.9 % IV SOLN: 250.0000 mL | INTRAVENOUS | Status: DC

## 2016-03-11 MED ORDER — POLYETHYLENE GLYCOL 3350 17 G PO PACK: 17.0000 g | PACK | Freq: Every day | ORAL | Status: DC | PRN

## 2016-03-11 MED ORDER — LIDOCAINE 2% (20 MG/ML) 5 ML SYRINGE
INTRAMUSCULAR | Status: AC
  Filled 2016-03-11: qty 5

## 2016-03-11 MED ORDER — CEFAZOLIN IN D5W 1 GM/50ML IV SOLN
1.0000 g | Freq: Three times a day (TID) | INTRAVENOUS | Status: AC
  Administered 2016-03-11 – 2016-03-12 (×2): 1 g via INTRAVENOUS
  Filled 2016-03-11 (×2): qty 50

## 2016-03-11 MED ORDER — PROPOFOL 10 MG/ML IV BOLUS
INTRAVENOUS | Status: DC | PRN
  Administered 2016-03-11: 140 mg via INTRAVENOUS

## 2016-03-11 MED ORDER — CHLORHEXIDINE GLUCONATE 4 % EX LIQD: 60.0000 mL | Freq: Once | CUTANEOUS | Status: DC

## 2016-03-11 MED ORDER — GLYCOPYRROLATE 1 MG PO TABS
2.0000 mg | ORAL_TABLET | Freq: Two times a day (BID) | ORAL | Status: DC
  Administered 2016-03-11 – 2016-03-12 (×2): 2 mg via ORAL
  Filled 2016-03-11 (×3): qty 2

## 2016-03-11 MED ORDER — ACETAMINOPHEN 325 MG PO TABS: 650.0000 mg | ORAL_TABLET | ORAL | Status: DC | PRN

## 2016-03-11 MED ORDER — MENTHOL 3 MG MT LOZG
1.0000 | LOZENGE | OROMUCOSAL | Status: DC | PRN
  Filled 2016-03-11: qty 9

## 2016-03-11 SURGICAL SUPPLY — 57 items
BENZOIN TINCTURE PRP APPL 2/3 (GAUZE/BANDAGES/DRESSINGS) ×2 IMPLANT
BIT DRILL SKYLINE 12MM (BIT) ×1 IMPLANT
BLADE SURG ROTATE 9660 (MISCELLANEOUS) IMPLANT
BONE CERV LORDOTIC 14.5X12X6 (Bone Implant) ×2 IMPLANT
BONE CERV LORDOTIC 14.5X12X7 (Bone Implant) ×2 IMPLANT
BUR ROUND FLUTED 4 SOFT TCH (BURR) IMPLANT
CLSR STERI-STRIP ANTIMIC 1/2X4 (GAUZE/BANDAGES/DRESSINGS) ×2 IMPLANT
COLLAR CERV LO CONTOUR FIRM DE (SOFTGOODS) IMPLANT
CORDS BIPOLAR (ELECTRODE) ×2 IMPLANT
COVER SURGICAL LIGHT HANDLE (MISCELLANEOUS) ×2 IMPLANT
CRADLE DONUT ADULT HEAD (MISCELLANEOUS) ×2 IMPLANT
DRAPE C-ARM 42X72 X-RAY (DRAPES) ×2 IMPLANT
DRAPE MICROSCOPE LEICA (MISCELLANEOUS) ×2 IMPLANT
DRAPE PROXIMA HALF (DRAPES) ×2 IMPLANT
DRILL BIT SKYLINE 12MM (BIT) ×1
DURAPREP 6ML APPLICATOR 50/CS (WOUND CARE) ×2 IMPLANT
ELECT COATED BLADE 2.86 ST (ELECTRODE) ×2 IMPLANT
ELECT REM PT RETURN 9FT ADLT (ELECTROSURGICAL) ×2
ELECTRODE REM PT RTRN 9FT ADLT (ELECTROSURGICAL) ×1 IMPLANT
EVACUATOR 1/8 PVC DRAIN (DRAIN) ×2 IMPLANT
GAUZE SPONGE 4X4 12PLY STRL (GAUZE/BANDAGES/DRESSINGS) ×2 IMPLANT
GAUZE XEROFORM 1X8 LF (GAUZE/BANDAGES/DRESSINGS) IMPLANT
GLOVE BIOGEL PI IND STRL 8 (GLOVE) ×2 IMPLANT
GLOVE BIOGEL PI INDICATOR 8 (GLOVE) ×2
GLOVE ORTHO TXT STRL SZ7.5 (GLOVE) ×4 IMPLANT
GOWN STRL REUS W/ TWL LRG LVL3 (GOWN DISPOSABLE) ×1 IMPLANT
GOWN STRL REUS W/ TWL XL LVL3 (GOWN DISPOSABLE) ×1 IMPLANT
GOWN STRL REUS W/TWL 2XL LVL3 (GOWN DISPOSABLE) ×2 IMPLANT
GOWN STRL REUS W/TWL LRG LVL3 (GOWN DISPOSABLE) ×1
GOWN STRL REUS W/TWL XL LVL3 (GOWN DISPOSABLE) ×1
HEAD HALTER (SOFTGOODS) ×2 IMPLANT
HEMOSTAT SURGICEL 2X14 (HEMOSTASIS) IMPLANT
KIT BASIN OR (CUSTOM PROCEDURE TRAY) ×2 IMPLANT
KIT ROOM TURNOVER OR (KITS) ×2 IMPLANT
MANIFOLD NEPTUNE II (INSTRUMENTS) IMPLANT
MATRIX HEMOSTAT SURGIFLO (HEMOSTASIS) ×2 IMPLANT
NEEDLE 25GX 5/8IN NON SAFETY (NEEDLE) ×2 IMPLANT
NS IRRIG 1000ML POUR BTL (IV SOLUTION) ×2 IMPLANT
PACK ORTHO CERVICAL (CUSTOM PROCEDURE TRAY) ×2 IMPLANT
PAD ARMBOARD 7.5X6 YLW CONV (MISCELLANEOUS) ×4 IMPLANT
PATTIES SURGICAL .5 X.5 (GAUZE/BANDAGES/DRESSINGS) IMPLANT
PLATE SKYLINE 12MM (Plate) ×4 IMPLANT
RESTRAINT LIMB HOLDER UNIV (RESTRAINTS) IMPLANT
SCREW SKYLINE VARIABLE LG (Screw) ×2 IMPLANT
SCREW VARIABLE SELF TAP 12MM (Screw) ×14 IMPLANT
SPONGE GAUZE 4X4 12PLY STER LF (GAUZE/BANDAGES/DRESSINGS) ×2 IMPLANT
STRIP CLOSURE SKIN 1/2X4 (GAUZE/BANDAGES/DRESSINGS) ×2 IMPLANT
SURGIFLO W/THROMBIN 8M KIT (HEMOSTASIS) IMPLANT
SUT BONE WAX W31G (SUTURE) ×2 IMPLANT
SUT SILK 3 0 (SUTURE) ×2
SUT SILK 3-0 18XBRD TIE 12 (SUTURE) ×2 IMPLANT
SUT VIC AB 3-0 X1 27 (SUTURE) ×2 IMPLANT
SUT VICRYL 4-0 PS2 18IN ABS (SUTURE) ×4 IMPLANT
SYR 30ML SLIP (SYRINGE) ×2 IMPLANT
TOWEL OR 17X24 6PK STRL BLUE (TOWEL DISPOSABLE) ×2 IMPLANT
TOWEL OR 17X26 10 PK STRL BLUE (TOWEL DISPOSABLE) ×2 IMPLANT
TRAY FOLEY CATH 16FR SILVER (SET/KITS/TRAYS/PACK) IMPLANT

## 2016-03-11 NOTE — Progress Notes (Signed)
Orthopedic Tech Progress Note Patient Details:  Mare LoanBeverly G Nesheiwat May 07, 1951 [id number]  Ortho Devices Type of Ortho Device: Soft collar Ortho Device/Splint Interventions: Application   Saul FordyceJennifer C Sherhonda Gaspar 03/11/2016, 5:08 PM

## 2016-03-11 NOTE — Interval H&P Note (Signed)
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 After consideration of risks, benefits and other options for treatment, the patient has consented to  Procedure(s): C3-4, C5-6 ANTERIOR CERVICAL DECOMPRESSION/DISCECTOMY FUSION 2 LEVELS, ALLOGRAFT, PLATE (N/A) as a surgical intervention .  The patient's history has been reviewed, patient examined, no change in status, stable for surgery.  I have reviewed the patient's chart and labs.  Questions were answered to the patient's satisfaction.     Wilder Amodei C

## 2016-03-11 NOTE — Anesthesia Postprocedure Evaluation (Signed)
Anesthesia Post Note  Patient: Mare LoanBeverly G Schwabe  Procedure(s) Performed: Procedure(s) (LRB): C3-4, C5-6 ANTERIOR CERVICAL DECOMPRESSION/DISCECTOMY FUSION 2 LEVELS, ALLOGRAFT, PLATE (N/A)  Patient location during evaluation: PACU Anesthesia Type: General Level of consciousness: awake and alert Pain management: pain level controlled Vital Signs Assessment: post-procedure vital signs reviewed and stable Respiratory status: spontaneous breathing, nonlabored ventilation, respiratory function stable and patient connected to nasal cannula oxygen Cardiovascular status: blood pressure returned to baseline and stable Postop Assessment: no signs of nausea or vomiting Anesthetic complications: no    Last Vitals:  Vitals:   03/11/16 1255 03/11/16 1316  BP: 138/81 140/76  Pulse: 75 80  Resp: 10 16  Temp:  36.2 C    Last Pain:  Vitals:   03/11/16 1316  TempSrc: Oral                 Linton RumpJennifer Dickerson Emrik Erhard

## 2016-03-11 NOTE — Anesthesia Procedure Notes (Addendum)
Procedure Name: Intubation Date/Time: 03/11/2016 7:43 AM Performed by: Lovie CholOCK, Lawanda Holzheimer K Pre-anesthesia Checklist: Patient identified, Emergency Drugs available, Suction available and Patient being monitored Patient Re-evaluated:Patient Re-evaluated prior to inductionOxygen Delivery Method: Circle System Utilized Preoxygenation: Pre-oxygenation with 100% oxygen Intubation Type: IV induction Ventilation: Mask ventilation without difficulty Laryngoscope Size: Glidescope and 3 Grade View: Grade I Tube type: Oral Number of attempts: 1 Airway Equipment and Method: Video-laryngoscopy and Stylet Placement Confirmation: ETT inserted through vocal cords under direct vision,  positive ETCO2 and breath sounds checked- equal and bilateral Secured at: 22 cm Tube secured with: Tape Dental Injury: Teeth and Oropharynx as per pre-operative assessment  Comments: Head and neck maintained neutral throughout intubation. Intubation performed by Mallie SnooksJennifer Campbell, SRNA

## 2016-03-11 NOTE — Progress Notes (Signed)
Dr. Ophelia CharterYates talked with patient on phone and in return called RN with new orders for pain control.

## 2016-03-11 NOTE — Transfer of Care (Signed)
Immediate Anesthesia Transfer of Care Note  Patient: Paige Turner  Procedure(s) Performed: Procedure(s): C3-4, C5-6 ANTERIOR CERVICAL DECOMPRESSION/DISCECTOMY FUSION 2 LEVELS, ALLOGRAFT, PLATE (N/A)  Patient Location: PACU  Anesthesia Type:General  Level of Consciousness: oriented, sedated and patient cooperative  Airway & Oxygen Therapy: Patient Spontanous Breathing and Patient connected to nasal cannula oxygen  Post-op Assessment: Report given to RN and Post -op Vital signs reviewed and stable  Post vital signs: Reviewed  Last Vitals:  Vitals:   03/11/16 0558  BP: (!) 150/80  Pulse: 64  Resp: 20  Temp: 36.5 C    Last Pain:  Vitals:   03/11/16 0558  TempSrc: Oral         Complications: No apparent anesthesia complications

## 2016-03-11 NOTE — Progress Notes (Signed)
Paged on call for Dr. Ophelia CharterYates regarding pt preferring tramadol for pain management vs percocet that is currently ordered.  Awaiting call back.

## 2016-03-11 NOTE — Brief Op Note (Signed)
03/11/2016  11:40 AM  PATIENT:  Paige Turner  65 y.o. 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  PROCEDURE:  Procedure(s): C3-4, C5-6 ANTERIOR CERVICAL DECOMPRESSION/DISCECTOMY FUSION 2 LEVELS, ALLOGRAFT, PLATE (N/A)  SURGEON:  Surgeon(s) and Role:    * Eldred MangesMark C Yates, MD - Primary  PHYSICIAN ASSISTANT: Zonia Kiefjames Linzie Criss pa-c    ANESTHESIA:   general  EBL:  Total I/O In: 1000 [I.V.:1000] Out: 100 [Blood:100]  BLOOD ADMINISTERED:none  DRAINS: hemovac LOCAL MEDICATIONS USED:  MARCAINE      SPECIMEN:  No Specimen  DISPOSITION OF SPECIMEN:  N/A  COUNTS:  YES  TOURNIQUET:  * No tourniquets in log *  DICTATION: .Dragon Dictation  PLAN OF CARE: Admit for overnight observation  PATIENT DISPOSITION:  PACU - hemodynamically stable.

## 2016-03-11 NOTE — Op Note (Signed)
NAMGaetana Michaelis:  Trivedi, Shavonte                ACCOUNT NO.:  [id number]  MEDICAL RECORD NO.:  [id number]  LOCATION:  [facility]                         FACILITY:  [facility]  PHYSICIAN:  Emmett Bracknell C. Ophelia CharterYates, M.D.    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  POSTOPERATIVE DIAGNOSES:  C3-4 and C5-6 spondylosis, herniated nucleus pulposus with myelopathy and severe cord compression at C3-4.  PROCEDURE:  C3-4, C5-6 anterior cervical diskectomy and fusion, allograft and plate.  SURGEON:  Aveer Bartow C. Ophelia CharterYates, M.D.  ASSISTANT:  Genene ChurnJames M. Barry Dieneswens, PA-C, medically necessary and present for the entire procedure.  ESTIMATED BLOOD LOSS:  Less than 100 mL.  IMPLANTS:  Skyline DePuy 12-mm plate x2, 16-XW12-mm screws x7, 12-mm rescue x1, C3-4 top left, VG2 7-mm graft at C3-4 and 6-mm at C5-6.  ANESTHESIA:  General plus Marcaine local.  DESCRIPTION OF PROCEDURE:  After head halter traction, after intubation, arms tucked to the side, careful pads, wrist restraints for traction for x-ray pictures during the case.  Neck was prepped with DuraPrep.  Ancef was given prophylactically.  The area was squared with towels, Betadine, Steri-Drape, sterile Mayo stand at the head and thyroid sheets and drapes.  Time-out procedure was completed.  Incision was made over the C4 level based on palpable landmarks.  Initially, dissection below the omohyoid down to the large spur at C5-6, confirmed with the lateral C- arm image after the C-arm had been draped.  Next, going above the omohyoid, we exposed the C3-4 level with some difficulty.  Self- retaining retractor was placed originally the Cloward and then we switched to the shadow retractor.  Diskectomy was performed.  Operative microscope was brought in and thick chunks of ligament  were removed. Unfortunately did not come out at as one piece and numerous small pieces cut being teased up until the dura was well visualized and completely decompressed.  Trial sizers 6 with slightly loose 7-mm graft was selected, countersunk 2 mm after uncovertebral joints had been stripped. Some Surgiflo was used for some epidural bleeding.  A 12-mm plate was selected.  Screws were placed.  The top left screw angled slightly inferior, looked like it might have been catching the top portion of the graft, so it was backed out, redrilled slightly more cephalad and then the screw was inserted.  Identical procedure was performed at C5-6.  At this level, there were large spurs more spondylosis, posterior spurs had came down from C5 overlying the disk space and these had to be removed with microdissection, 1 and 2-mm Kerrison until the dura was completely decompressed.  There was some disk protrusions, combination of hard and soft disk.  Six graft gave nice tight fit.  It was countersunk 2 mm and final spot pictures were taken showing good position of the grafts, screws and plates.  Irrigation, Hemovac placed with in-and-out technique on the left side.  Platysma was closed with 3-0 Vicryl.  At the beginning of the dissection, there were several superficial large veins that  ran parallel and had side branches between each other.  Lateral most one was tied off, it was 7-8 mm, which was large for this area.  The one next to it measured 11 mm.  These were all superficial.  They were tied with 3-0 silk, coagulated at the tip with bipolar and were dried at the end of the case.  Soft collar was applied.  The patient was transferred to the recovery room and she was neurologically intact.     Kaeya Schiffer C. Ophelia CharterYates, M.D.     MCY/MEDQ  D:  03/11/2016  T:  03/11/2016  Job:  [id number]

## 2016-03-12 DIAGNOSIS — K219 Gastro-esophageal reflux disease without esophagitis: Secondary | ICD-10-CM | POA: Diagnosis present

## 2016-03-12 DIAGNOSIS — M5001 Cervical disc disorder with myelopathy,  high cervical region: Secondary | ICD-10-CM | POA: Diagnosis present

## 2016-03-12 DIAGNOSIS — M542 Cervicalgia: Secondary | ICD-10-CM | POA: Diagnosis present

## 2016-03-12 DIAGNOSIS — Z87891 Personal history of nicotine dependence: Secondary | ICD-10-CM | POA: Diagnosis not present

## 2016-03-12 DIAGNOSIS — M4802 Spinal stenosis, cervical region: Secondary | ICD-10-CM | POA: Diagnosis present

## 2016-03-12 DIAGNOSIS — M4312 Spondylolisthesis, cervical region: Secondary | ICD-10-CM | POA: Diagnosis present

## 2016-03-12 DIAGNOSIS — M4712 Other spondylosis with myelopathy, cervical region: Secondary | ICD-10-CM | POA: Diagnosis present

## 2016-03-12 NOTE — Progress Notes (Signed)
Spoke to Dr August Saucerean about patient needing a prescription for tramadol, [facility]

## 2016-03-12 NOTE — Care Management Note (Signed)
Case Management Note  Patient Details  Name: Paige Turner MRN: [id number] Date of Birth: Dec 12, 1950  Subjective/Objective: 65 yo F s/p C3-4, C5-6 anterior cervical diskectomy and fusion, allograft and plate                 Action/Plan: received referral to assist with [facility] needs   Expected Discharge Date: 03/12/16                 Expected Discharge Plan:  Home/Self Care  In-House Referral:     Discharge planning Services  CM Consult  Post Acute Care Choice:    Choice offered to:     DME Arranged:    DME Agency:     HH Arranged:    HH Agency:     Status of Service:  Completed, signed off  If discussed at [employer] of Stay Meetings, dates discussed:    Additional Comments: met with pt at bedside. D/C plan is to return home. She stated that she lives alone. Her sister is picking her up today. She reports that she planned the surgery and bought food before the surgery. She denies any d/c needs. She is able to go to the bathroom at home.  Norina Buzzard, RN 03/12/2016, 10:12 AM

## 2016-03-12 NOTE — Progress Notes (Signed)
Subjective: Patient stable pain controlled neck drain removed   Objective: Vital signs in last 24 hours: Temp:  [97.1 F (36.2 C)-98.8 F (37.1 C)] 98.3 F (36.8 C) (08/19 0552) Pulse Rate:  [66-87] 83 (08/19 0552) Resp:  [10-25] 16 (08/18 1316) BP: (129-156)/(69-83) 129/73 (08/19 0552) SpO2:  [93 %-100 %] 100 % (08/19 0552)  Intake/Output from previous day: 08/18 0701 - 08/19 0700 In: 2152.5 [P.O.:240; I.V.:1862.5; IV Piggyback:50] Out: 150 [Drains:50; Blood:100] Intake/Output this shift: No intake/output data recorded.  Exam:  Dorsiflexion/Plantar flexion intact  Labs: No results for input(s): HGB in the last 72 hours. No results for input(s): WBC, RBC, HCT, PLT in the last 72 hours. No results for input(s): NA, K, CL, CO2, BUN, CREATININE, GLUCOSE, CALCIUM in the last 72 hours. No results for input(s): LABPT, INR in the last 72 hours.  Assessment/Plan: Plan discharge today   Lysa Livengood SCOTT 03/12/2016, 7:20 AM

## 2016-03-12 NOTE — Progress Notes (Signed)
Patient discharged to home, discharge instructions given, patient stated she understood 

## 2016-03-14 ENCOUNTER — Encounter (HOSPITAL_COMMUNITY): Payer: Self-pay | Admitting: Orthopaedic Surgery

## 2016-03-24 NOTE — Discharge Summary (Signed)
Patient ID: Paige Turner MRN: [id number] DOB/AGE: [birth year] 65 y.o.  Admit date: 03/11/2016 Discharge date: 03/24/2016  Admission Diagnoses:  Active Problems:   Cervical spinal stenosis   Discharge Diagnoses:  Active Problems:   Cervical spinal stenosis  status post Procedure(s): C3-4, C5-6 ANTERIOR CERVICAL DECOMPRESSION/DISCECTOMY FUSION 2 LEVELS, ALLOGRAFT, PLATE  Past Medical History:  Diagnosis Date  . Anemia   . Arthritis    right hand  . Arthritis of carpometacarpal joint 03/2015   left thumb  . GERD (gastroesophageal reflux disease)   . High cholesterol   . Irritable bowel syndrome (IBS)   . Ulcerative colitis (HCC)     Surgeries: Procedure(s): C3-4, C5-6 ANTERIOR CERVICAL DECOMPRESSION/DISCECTOMY FUSION 2 LEVELS, ALLOGRAFT, PLATE on 10/31/8117   Consultants:   Discharged Condition: Improved  Hospital Course: Paige Turner is an 65 y.o. female who was admitted 03/11/2016 for operative treatment of cervical stenosis. Patient failed conservative treatments (please see the history and physical for the specifics) and had severe unremitting pain that affects sleep, daily activities and work/hobbies. After pre-op clearance, the patient was taken to the operating room on 03/11/2016 and underwent  Procedure(s): C3-4, C5-6 ANTERIOR CERVICAL DECOMPRESSION/DISCECTOMY FUSION 2 LEVELS, ALLOGRAFT, PLATE.    Patient was given perioperative antibiotics:  Anti-infectives    Start     Dose/Rate Route Frequency Ordered Stop   03/11/16 1600  ceFAZolin (ANCEF) IVPB 1 g/50 mL premix     1 g 100 mL/hr over 30 Minutes Intravenous Every 8 hours 03/11/16 1303 03/12/16 0310   03/11/16 0557  ceFAZolin (ANCEF) IVPB 2g/100 mL premix     2 g 200 mL/hr over 30 Minutes Intravenous On call to O.R. 03/11/16 0557 03/11/16 0801       Patient was given sequential compression devices and early ambulation to prevent DVT.   Patient benefited maximally from hospital stay and there were no  complications. At the time of discharge, the patient was urinating/moving their bowels without difficulty, tolerating a regular diet, pain is controlled with oral pain medications and they have been cleared by PT/OT.   Recent vital signs: No data found.    Recent laboratory studies: No results for input(s): WBC, HGB, HCT, PLT, NA, K, CL, CO2, BUN, CREATININE, GLUCOSE, INR, CALCIUM in the last 72 hours.  Invalid input(s): PT, 2   Discharge Medications:     Medication List    STOP taking these medications   glucosamine-chondroitin 500-400 MG tablet     TAKE these medications   Biotin 1000 MCG tablet Take 1,000 mcg by mouth daily.   BUFFERED VITAMIN C 1000 MG Caps Take 2 tablets by mouth daily.   CALCIUM-MAGNESIUM-VITAMIN D PO Take 3 tablets by mouth daily.   colesevelam 625 MG tablet Commonly known as:  WELCHOL Take 1,875 mg by mouth 2 (two) times daily with a meal.   glycopyrrolate 2 MG tablet Commonly known as:  ROBINUL Take 2 mg by mouth 2 (two) times daily.   mercaptopurine 50 MG tablet Commonly known as:  PURINETHOL Take 25-50 mg by mouth See admin instructions. Pt takes 25 mg each morning, and 50 mg each evening.  Give on an empty stomach 1 hour before or 2 hours after meals. Caution: Chemotherapy.   mesalamine 1.2 g EC tablet Commonly known as:  LIALDA Take 4.8 g by mouth daily with breakfast.   oxyCODONE-acetaminophen 5-325 MG tablet Commonly known as:  PERCOCET/ROXICET Take 1-2 tablets by mouth every 6 (six) hours as needed for moderate pain.  PROBIOTIC DAILY PO Take 2 capsules by mouth daily.   RABEprazole 20 MG tablet Commonly known as:  ACIPHEX Take 20 mg by mouth 2 (two) times daily.       Diagnostic Studies: Dg Chest 2 View  Result Date: 02/26/2016 CLINICAL DATA:  Preop cervical fusion.  Former smoker. EXAM: CHEST  2 VIEW COMPARISON:  None. FINDINGS: Heart and mediastinal contours are within normal limits. No focal opacities or effusions. No  acute bony abnormality. IMPRESSION: No active cardiopulmonary disease. Electronically Signed   By: Charlett NoseKevin  Dover M.D.   On: 02/26/2016 09:48   Dg Cervical Spine 2-3 Views  Result Date: 03/11/2016 CLINICAL DATA:  Cervical spine fusion. EXAM: CERVICAL SPINE - 2-3 VIEW COMPARISON:  MRI 01/23/2016. FINDINGS: C3-C4 and C5-C6 anterior fusion with good anatomic alignment. No acute bony abnormality. 3 images obtained. 0 minutes 9 seconds fluoroscopy time utilized. IMPRESSION: C3-C4 and C5-C6 anterior fusion with good anatomic alignment . Electronically Signed   By: Maisie Fushomas  Register   On: 03/11/2016 10:59   Dg C-arm 1-60 Min  Result Date: 03/11/2016 CLINICAL DATA:  Cervical spine surgery. EXAM: DG C-ARM 61-120 MIN COMPARISON:  MRI 01/23/2016. FINDINGS: C3-C4 and C5-C6 anterior fusion with good anatomic alignment. Hardware intact. No acute bony abnormality identified . IMPRESSION: C3-C4-C5-C6 anterior fusion with good anatomic alignment. Electronically Signed   By: Maisie Fushomas  Register   On: 03/11/2016 10:58    Discharge Instructions    Call MD / Call 911    Complete by:  As directed   If you experience chest pain or shortness of breath, CALL 911 and be transported to the hospital emergency room.  If you develope a fever above 101 F, pus (white drainage) or increased drainage or redness at the wound, or calf pain, call your surgeon's office.   Constipation Prevention    Complete by:  As directed   Drink plenty of fluids.  Prune juice may be helpful.  You may use a stool softener, such as Colace (over the counter) 100 mg twice a day.  Use MiraLax (over the counter) for constipation as needed.   Diet - low sodium heart healthy    Complete by:  As directed   Increase activity slowly as tolerated    Complete by:  As directed        Discharge Plan:  discharge to home  Disposition:     Signed: Naida SleightWENS,Lebaron Bautch M for Mark yates md 03/24/2016, 10:44 AM

## 2016-11-10 IMAGING — CR DG CHEST 2V
2 series · 2 of 2 positions shown · non-contrast
Comparison: None.

CLINICAL DATA: Preop cervical fusion.  Former smoker.

EXAM:
CHEST  2 VIEW

[w chest pa]
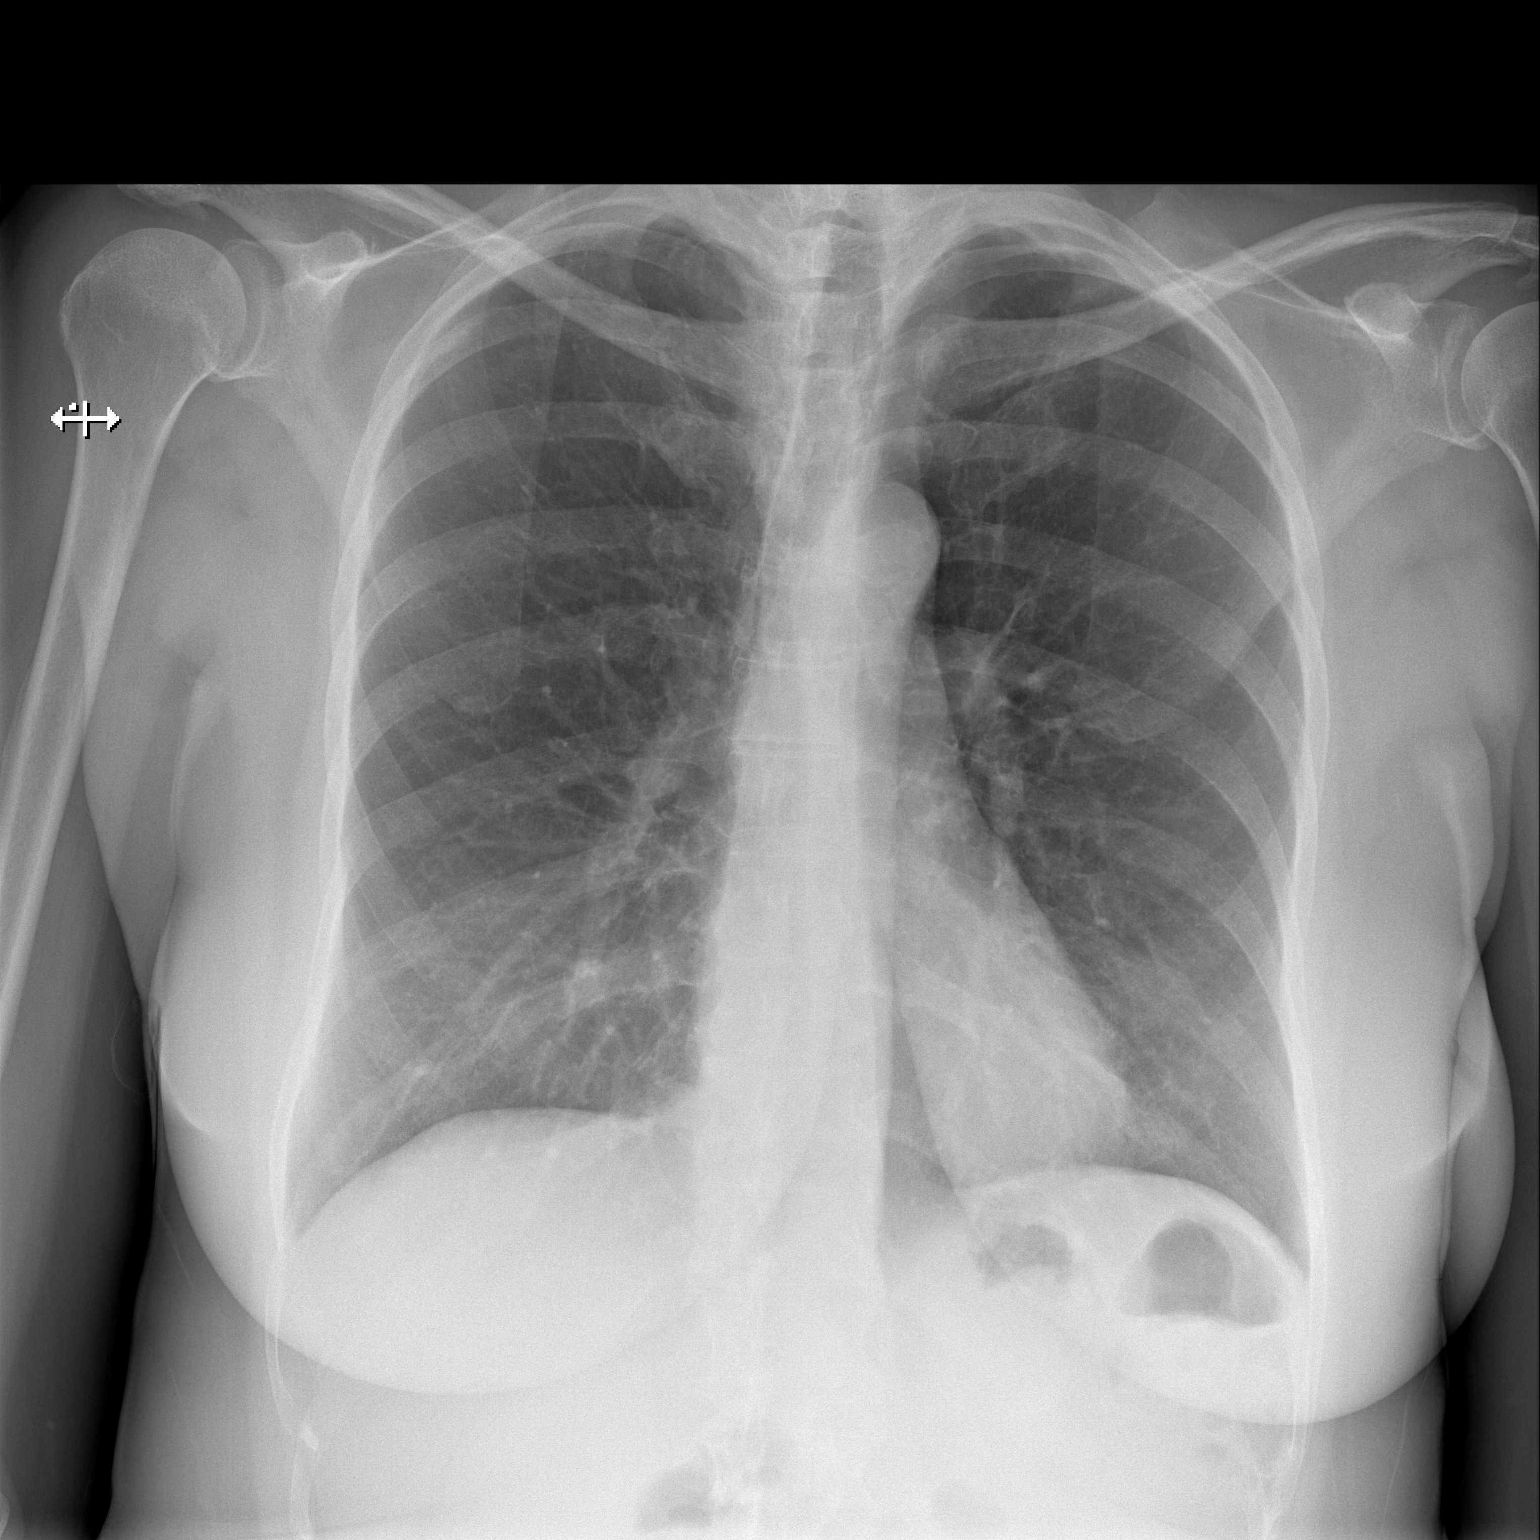

[w chest lat]
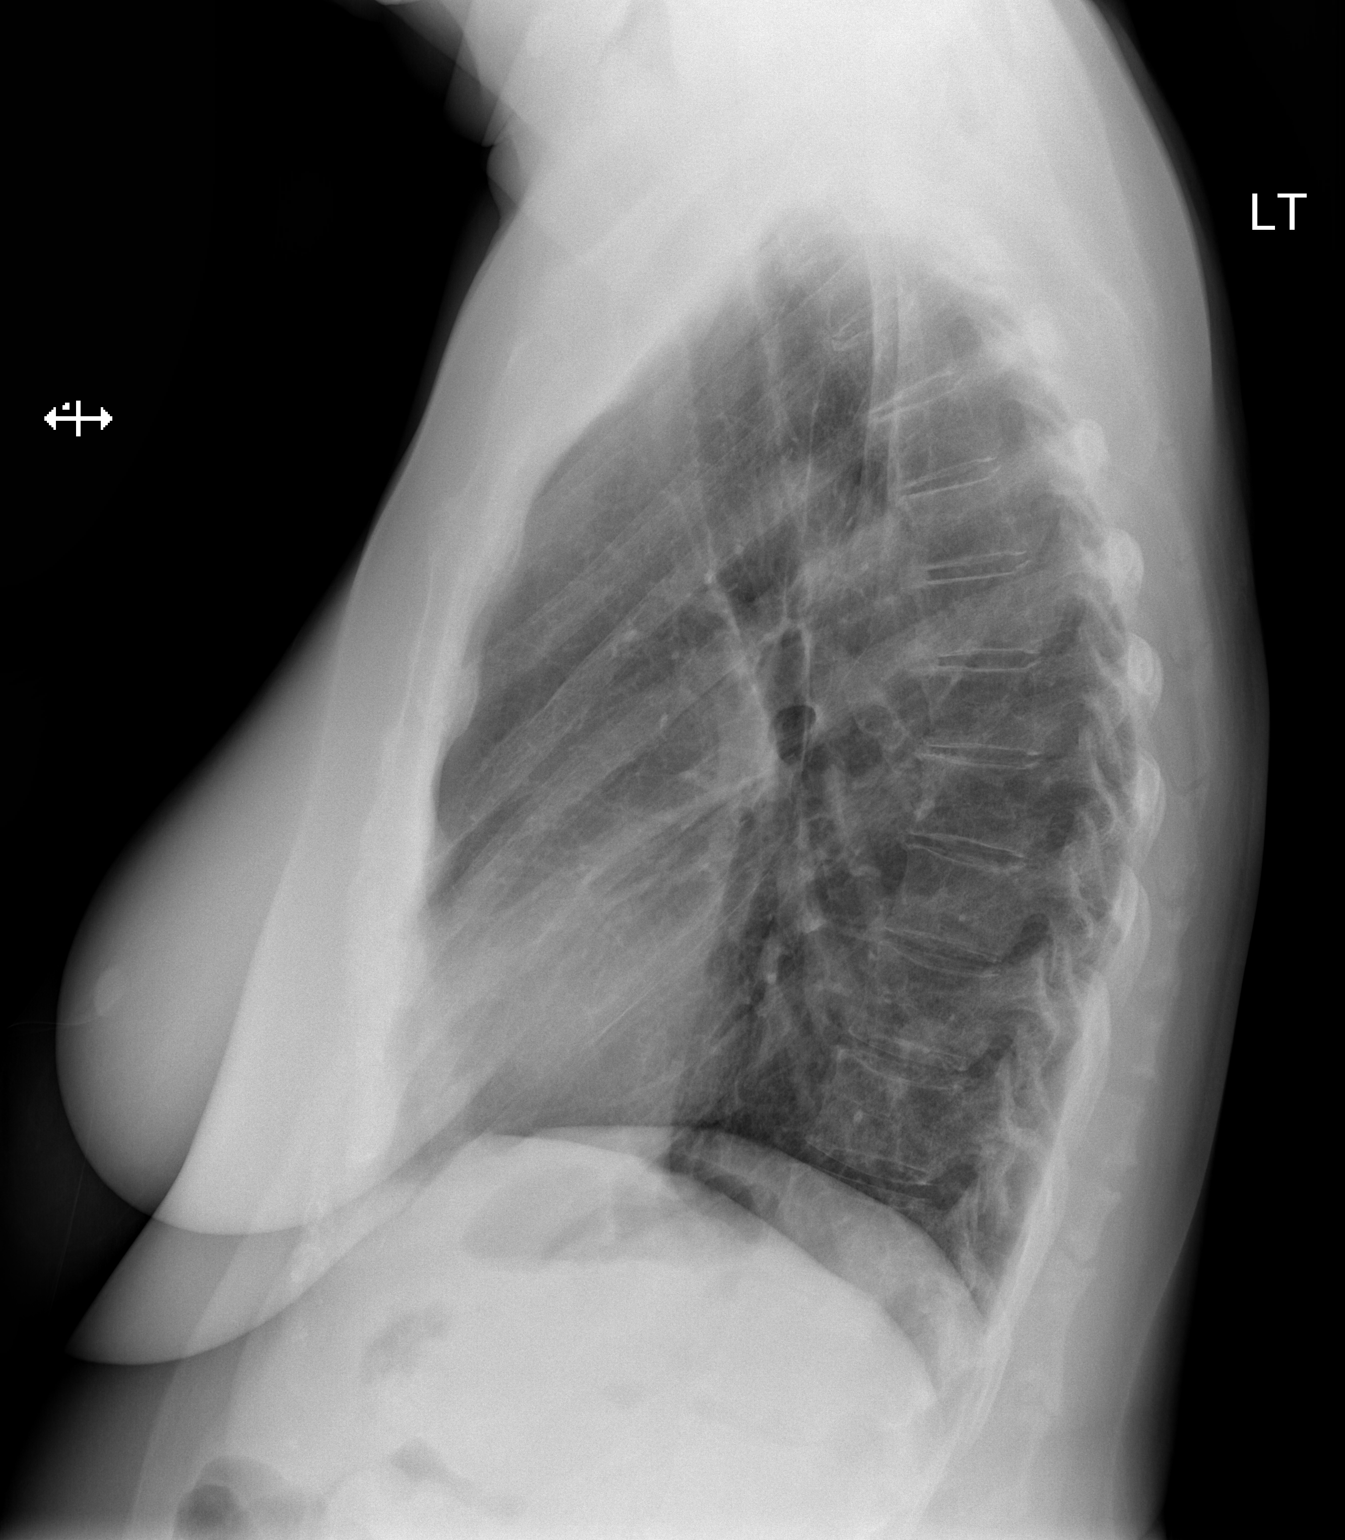

[2 of 2 positions shown; findings below may reference images not displayed]

FINDINGS: Heart and mediastinal contours are within normal limits. No focal
opacities or effusions. No acute bony abnormality.
IMPRESSION: No active cardiopulmonary disease.

## 2016-11-24 IMAGING — RF DG CERVICAL SPINE 2 OR 3 VIEWS
1 series · 3 of 3 positions shown · non-contrast
Comparison: MRI 01/23/2016.

CLINICAL DATA: Cervical spine fusion.

EXAM:
CERVICAL SPINE - 2-3 VIEW

[Series 1: run · 3 of 3 slices shown]
[im 1/3]
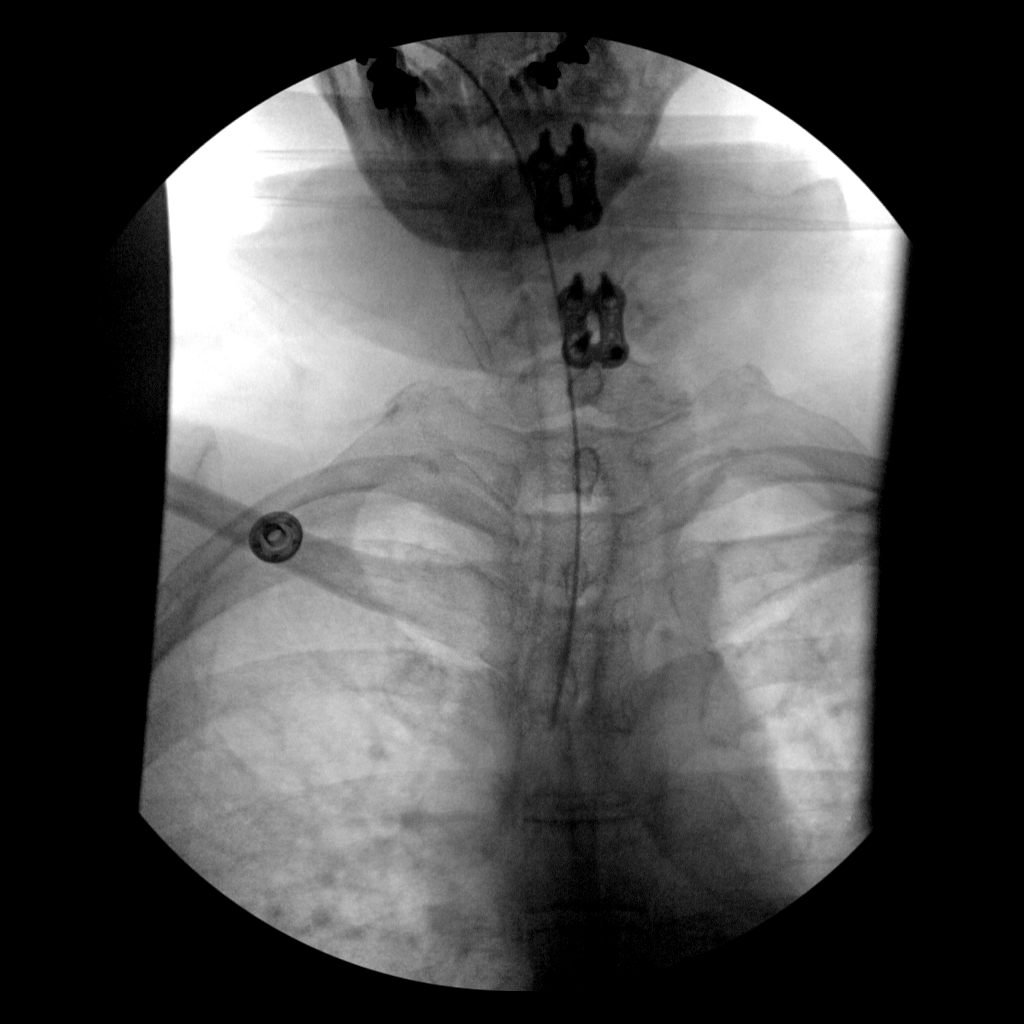
[im 2/3]
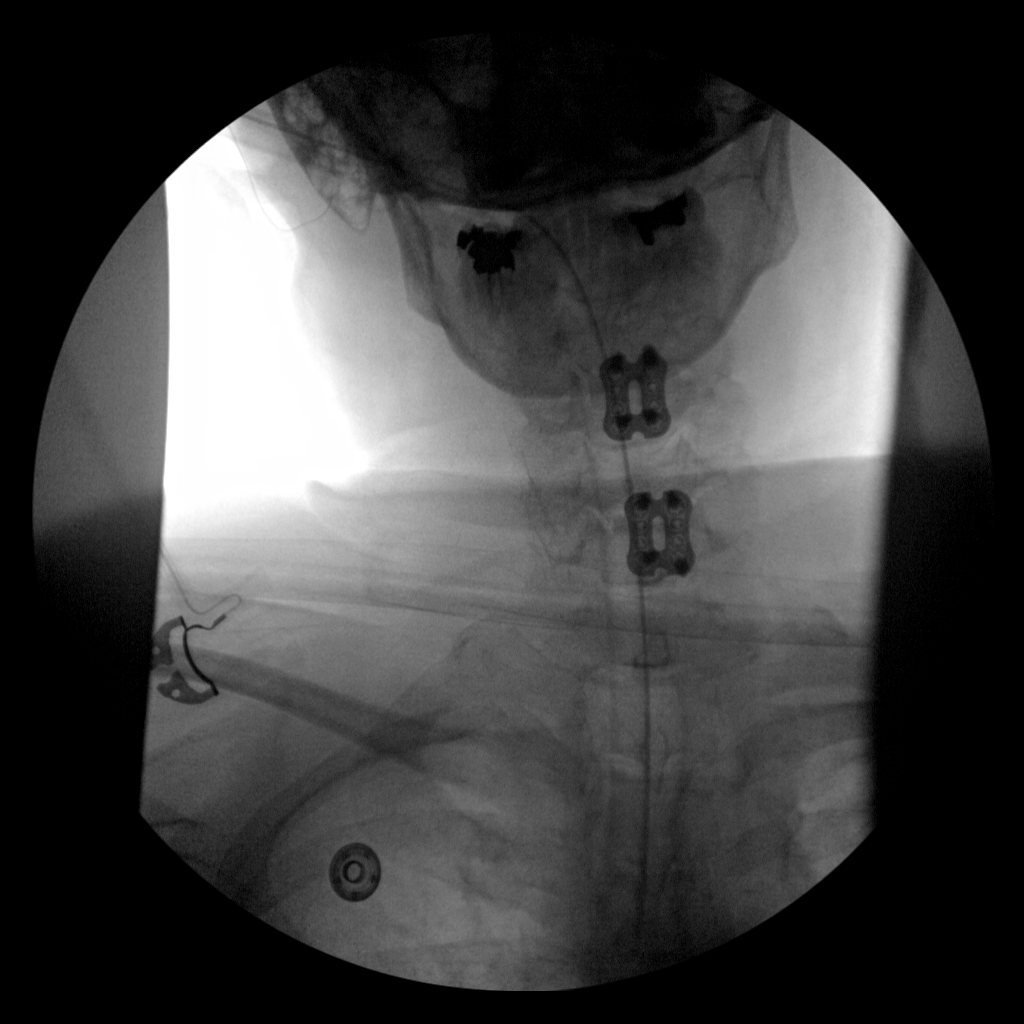
[im 3/3]
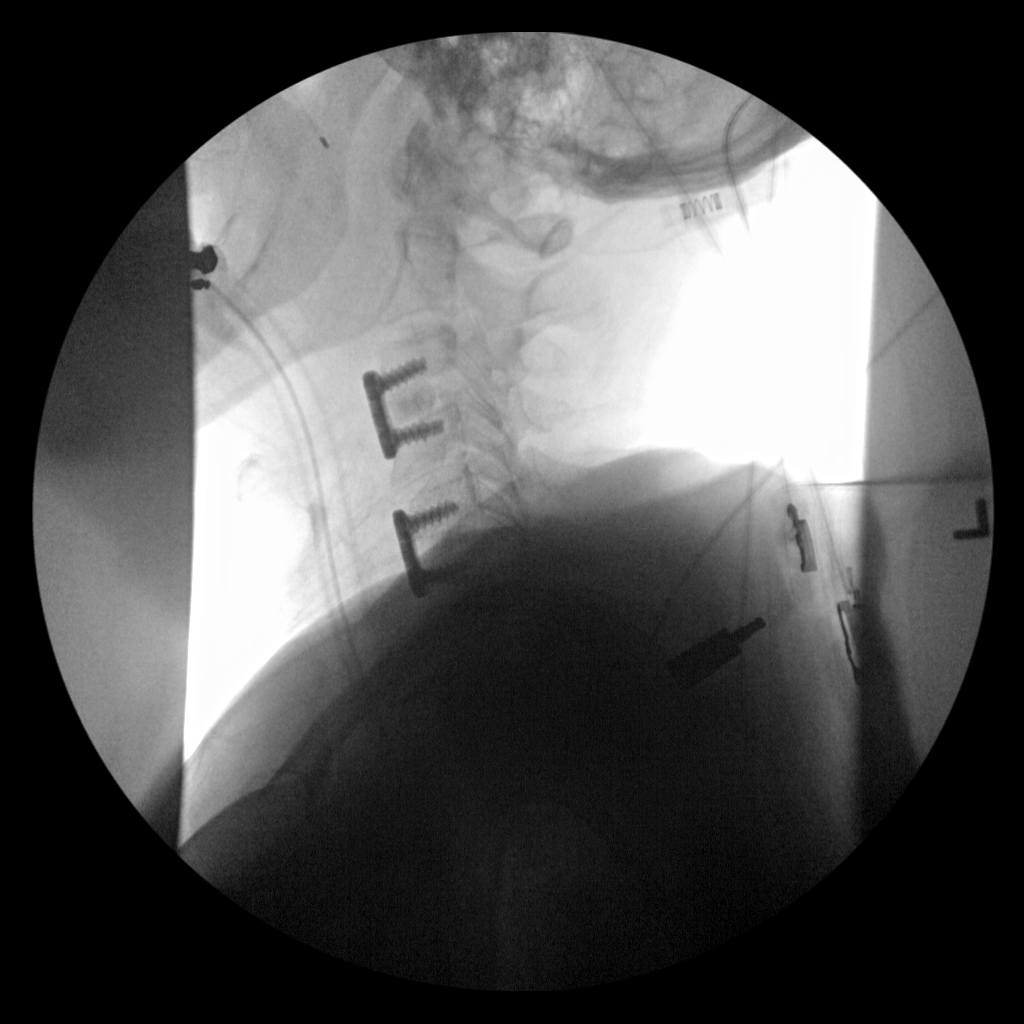

[3 of 3 positions shown; findings below may reference images not displayed]

FINDINGS: C3-C4 and C5-C6 anterior fusion with good anatomic alignment. No
acute bony abnormality. 3 images obtained. 0 minutes 9 seconds
fluoroscopy time utilized.
IMPRESSION: C3-C4 and C5-C6 anterior fusion with good anatomic alignment .
